# Patient Record
Sex: Female | Born: 1935
Health system: Southern US, Community
[De-identification: ages and names within clinical notes are randomized; demographics above are authoritative.]

---

## 1999-07-16 ENCOUNTER — Other Ambulatory Visit: Admission: RE | Admit: 1999-07-16 | Discharge: 1999-07-29 | Payer: Self-pay

## 2015-10-16 DIAGNOSIS — Z9181 History of falling: Secondary | ICD-10-CM | POA: Diagnosis not present

## 2015-10-16 DIAGNOSIS — Z87898 Personal history of other specified conditions: Secondary | ICD-10-CM | POA: Diagnosis not present

## 2015-10-16 DIAGNOSIS — Z79899 Other long term (current) drug therapy: Secondary | ICD-10-CM | POA: Diagnosis not present

## 2015-10-16 DIAGNOSIS — E669 Obesity, unspecified: Secondary | ICD-10-CM | POA: Diagnosis not present

## 2015-10-16 DIAGNOSIS — R42 Dizziness and giddiness: Secondary | ICD-10-CM | POA: Diagnosis not present

## 2015-10-16 DIAGNOSIS — I10 Essential (primary) hypertension: Secondary | ICD-10-CM | POA: Diagnosis not present

## 2015-11-20 DIAGNOSIS — I10 Essential (primary) hypertension: Secondary | ICD-10-CM | POA: Diagnosis not present

## 2015-11-28 DIAGNOSIS — F209 Schizophrenia, unspecified: Secondary | ICD-10-CM | POA: Diagnosis present

## 2015-11-28 DIAGNOSIS — N189 Chronic kidney disease, unspecified: Secondary | ICD-10-CM | POA: Diagnosis present

## 2015-11-28 DIAGNOSIS — R918 Other nonspecific abnormal finding of lung field: Secondary | ICD-10-CM | POA: Diagnosis not present

## 2015-11-28 DIAGNOSIS — A4151 Sepsis due to Escherichia coli [E. coli]: Secondary | ICD-10-CM | POA: Diagnosis present

## 2015-11-28 DIAGNOSIS — I878 Other specified disorders of veins: Secondary | ICD-10-CM | POA: Diagnosis present

## 2015-11-28 DIAGNOSIS — Z87891 Personal history of nicotine dependence: Secondary | ICD-10-CM | POA: Diagnosis not present

## 2015-11-28 DIAGNOSIS — D62 Acute posthemorrhagic anemia: Secondary | ICD-10-CM | POA: Diagnosis present

## 2015-11-28 DIAGNOSIS — M542 Cervicalgia: Secondary | ICD-10-CM | POA: Diagnosis not present

## 2015-11-28 DIAGNOSIS — I350 Nonrheumatic aortic (valve) stenosis: Secondary | ICD-10-CM | POA: Diagnosis present

## 2015-11-28 DIAGNOSIS — E782 Mixed hyperlipidemia: Secondary | ICD-10-CM | POA: Diagnosis present

## 2015-11-28 DIAGNOSIS — Z23 Encounter for immunization: Secondary | ICD-10-CM | POA: Diagnosis not present

## 2015-11-28 DIAGNOSIS — R4182 Altered mental status, unspecified: Secondary | ICD-10-CM | POA: Diagnosis not present

## 2015-11-28 DIAGNOSIS — I129 Hypertensive chronic kidney disease with stage 1 through stage 4 chronic kidney disease, or unspecified chronic kidney disease: Secondary | ICD-10-CM | POA: Diagnosis present

## 2015-11-28 DIAGNOSIS — K5790 Diverticulosis of intestine, part unspecified, without perforation or abscess without bleeding: Secondary | ICD-10-CM | POA: Diagnosis not present

## 2015-11-28 DIAGNOSIS — B9620 Unspecified Escherichia coli [E. coli] as the cause of diseases classified elsewhere: Secondary | ICD-10-CM | POA: Diagnosis present

## 2015-11-28 DIAGNOSIS — G4733 Obstructive sleep apnea (adult) (pediatric): Secondary | ICD-10-CM | POA: Diagnosis present

## 2015-11-28 DIAGNOSIS — A419 Sepsis, unspecified organism: Secondary | ICD-10-CM | POA: Diagnosis not present

## 2015-11-28 DIAGNOSIS — J9811 Atelectasis: Secondary | ICD-10-CM | POA: Diagnosis not present

## 2015-11-28 DIAGNOSIS — I8392 Asymptomatic varicose veins of left lower extremity: Secondary | ICD-10-CM | POA: Diagnosis not present

## 2015-11-28 DIAGNOSIS — R911 Solitary pulmonary nodule: Secondary | ICD-10-CM | POA: Diagnosis not present

## 2015-11-28 DIAGNOSIS — K5909 Other constipation: Secondary | ICD-10-CM | POA: Diagnosis present

## 2015-11-28 DIAGNOSIS — N3090 Cystitis, unspecified without hematuria: Secondary | ICD-10-CM | POA: Diagnosis present

## 2015-11-28 DIAGNOSIS — R6520 Severe sepsis without septic shock: Secondary | ICD-10-CM | POA: Diagnosis not present

## 2015-11-28 DIAGNOSIS — G629 Polyneuropathy, unspecified: Secondary | ICD-10-CM | POA: Diagnosis present

## 2015-11-28 DIAGNOSIS — I83892 Varicose veins of left lower extremities with other complications: Secondary | ICD-10-CM | POA: Diagnosis present

## 2015-11-28 DIAGNOSIS — F319 Bipolar disorder, unspecified: Secondary | ICD-10-CM | POA: Diagnosis present

## 2015-11-28 DIAGNOSIS — R296 Repeated falls: Secondary | ICD-10-CM | POA: Diagnosis present

## 2015-11-28 DIAGNOSIS — N179 Acute kidney failure, unspecified: Secondary | ICD-10-CM | POA: Diagnosis not present

## 2015-11-28 DIAGNOSIS — N390 Urinary tract infection, site not specified: Secondary | ICD-10-CM | POA: Diagnosis not present

## 2015-11-28 DIAGNOSIS — S79919A Unspecified injury of unspecified hip, initial encounter: Secondary | ICD-10-CM | POA: Diagnosis not present

## 2015-12-08 DIAGNOSIS — Z09 Encounter for follow-up examination after completed treatment for conditions other than malignant neoplasm: Secondary | ICD-10-CM | POA: Diagnosis not present

## 2015-12-08 DIAGNOSIS — D649 Anemia, unspecified: Secondary | ICD-10-CM | POA: Diagnosis not present

## 2015-12-08 DIAGNOSIS — R296 Repeated falls: Secondary | ICD-10-CM | POA: Diagnosis not present

## 2015-12-08 DIAGNOSIS — Z5181 Encounter for therapeutic drug level monitoring: Secondary | ICD-10-CM | POA: Diagnosis not present

## 2015-12-14 DIAGNOSIS — Z87891 Personal history of nicotine dependence: Secondary | ICD-10-CM | POA: Diagnosis not present

## 2015-12-14 DIAGNOSIS — M4812 Ankylosing hyperostosis [Forestier], cervical region: Secondary | ICD-10-CM | POA: Diagnosis not present

## 2015-12-14 DIAGNOSIS — N182 Chronic kidney disease, stage 2 (mild): Secondary | ICD-10-CM | POA: Diagnosis not present

## 2015-12-14 DIAGNOSIS — W010XXD Fall on same level from slipping, tripping and stumbling without subsequent striking against object, subsequent encounter: Secondary | ICD-10-CM | POA: Diagnosis not present

## 2015-12-14 DIAGNOSIS — R296 Repeated falls: Secondary | ICD-10-CM | POA: Diagnosis not present

## 2015-12-14 DIAGNOSIS — I129 Hypertensive chronic kidney disease with stage 1 through stage 4 chronic kidney disease, or unspecified chronic kidney disease: Secondary | ICD-10-CM | POA: Diagnosis not present

## 2015-12-14 DIAGNOSIS — M5134 Other intervertebral disc degeneration, thoracic region: Secondary | ICD-10-CM | POA: Diagnosis not present

## 2015-12-14 DIAGNOSIS — M47817 Spondylosis without myelopathy or radiculopathy, lumbosacral region: Secondary | ICD-10-CM | POA: Diagnosis not present

## 2015-12-14 DIAGNOSIS — Z9181 History of falling: Secondary | ICD-10-CM | POA: Diagnosis not present

## 2015-12-15 DIAGNOSIS — M4812 Ankylosing hyperostosis [Forestier], cervical region: Secondary | ICD-10-CM | POA: Diagnosis not present

## 2015-12-15 DIAGNOSIS — M5134 Other intervertebral disc degeneration, thoracic region: Secondary | ICD-10-CM | POA: Diagnosis not present

## 2015-12-15 DIAGNOSIS — N182 Chronic kidney disease, stage 2 (mild): Secondary | ICD-10-CM | POA: Diagnosis not present

## 2015-12-15 DIAGNOSIS — M47817 Spondylosis without myelopathy or radiculopathy, lumbosacral region: Secondary | ICD-10-CM | POA: Diagnosis not present

## 2015-12-15 DIAGNOSIS — R296 Repeated falls: Secondary | ICD-10-CM | POA: Diagnosis not present

## 2015-12-15 DIAGNOSIS — I129 Hypertensive chronic kidney disease with stage 1 through stage 4 chronic kidney disease, or unspecified chronic kidney disease: Secondary | ICD-10-CM | POA: Diagnosis not present

## 2015-12-17 DIAGNOSIS — R296 Repeated falls: Secondary | ICD-10-CM | POA: Diagnosis not present

## 2015-12-17 DIAGNOSIS — M47817 Spondylosis without myelopathy or radiculopathy, lumbosacral region: Secondary | ICD-10-CM | POA: Diagnosis not present

## 2015-12-17 DIAGNOSIS — I129 Hypertensive chronic kidney disease with stage 1 through stage 4 chronic kidney disease, or unspecified chronic kidney disease: Secondary | ICD-10-CM | POA: Diagnosis not present

## 2015-12-17 DIAGNOSIS — M5134 Other intervertebral disc degeneration, thoracic region: Secondary | ICD-10-CM | POA: Diagnosis not present

## 2015-12-17 DIAGNOSIS — N182 Chronic kidney disease, stage 2 (mild): Secondary | ICD-10-CM | POA: Diagnosis not present

## 2015-12-17 DIAGNOSIS — M4812 Ankylosing hyperostosis [Forestier], cervical region: Secondary | ICD-10-CM | POA: Diagnosis not present

## 2015-12-21 DIAGNOSIS — M4812 Ankylosing hyperostosis [Forestier], cervical region: Secondary | ICD-10-CM | POA: Diagnosis not present

## 2015-12-21 DIAGNOSIS — M47817 Spondylosis without myelopathy or radiculopathy, lumbosacral region: Secondary | ICD-10-CM | POA: Diagnosis not present

## 2015-12-21 DIAGNOSIS — N182 Chronic kidney disease, stage 2 (mild): Secondary | ICD-10-CM | POA: Diagnosis not present

## 2015-12-21 DIAGNOSIS — M5134 Other intervertebral disc degeneration, thoracic region: Secondary | ICD-10-CM | POA: Diagnosis not present

## 2015-12-21 DIAGNOSIS — R296 Repeated falls: Secondary | ICD-10-CM | POA: Diagnosis not present

## 2015-12-21 DIAGNOSIS — I129 Hypertensive chronic kidney disease with stage 1 through stage 4 chronic kidney disease, or unspecified chronic kidney disease: Secondary | ICD-10-CM | POA: Diagnosis not present

## 2015-12-28 DIAGNOSIS — M5134 Other intervertebral disc degeneration, thoracic region: Secondary | ICD-10-CM | POA: Diagnosis not present

## 2015-12-28 DIAGNOSIS — M47817 Spondylosis without myelopathy or radiculopathy, lumbosacral region: Secondary | ICD-10-CM | POA: Diagnosis not present

## 2015-12-28 DIAGNOSIS — M4812 Ankylosing hyperostosis [Forestier], cervical region: Secondary | ICD-10-CM | POA: Diagnosis not present

## 2015-12-28 DIAGNOSIS — I129 Hypertensive chronic kidney disease with stage 1 through stage 4 chronic kidney disease, or unspecified chronic kidney disease: Secondary | ICD-10-CM | POA: Diagnosis not present

## 2015-12-28 DIAGNOSIS — N182 Chronic kidney disease, stage 2 (mild): Secondary | ICD-10-CM | POA: Diagnosis not present

## 2015-12-28 DIAGNOSIS — R296 Repeated falls: Secondary | ICD-10-CM | POA: Diagnosis not present

## 2015-12-30 DIAGNOSIS — N182 Chronic kidney disease, stage 2 (mild): Secondary | ICD-10-CM | POA: Diagnosis not present

## 2015-12-30 DIAGNOSIS — M5134 Other intervertebral disc degeneration, thoracic region: Secondary | ICD-10-CM | POA: Diagnosis not present

## 2015-12-30 DIAGNOSIS — M47817 Spondylosis without myelopathy or radiculopathy, lumbosacral region: Secondary | ICD-10-CM | POA: Diagnosis not present

## 2015-12-30 DIAGNOSIS — R296 Repeated falls: Secondary | ICD-10-CM | POA: Diagnosis not present

## 2015-12-30 DIAGNOSIS — M4812 Ankylosing hyperostosis [Forestier], cervical region: Secondary | ICD-10-CM | POA: Diagnosis not present

## 2015-12-30 DIAGNOSIS — I129 Hypertensive chronic kidney disease with stage 1 through stage 4 chronic kidney disease, or unspecified chronic kidney disease: Secondary | ICD-10-CM | POA: Diagnosis not present

## 2016-01-01 DIAGNOSIS — M4812 Ankylosing hyperostosis [Forestier], cervical region: Secondary | ICD-10-CM | POA: Diagnosis not present

## 2016-01-01 DIAGNOSIS — M47817 Spondylosis without myelopathy or radiculopathy, lumbosacral region: Secondary | ICD-10-CM | POA: Diagnosis not present

## 2016-01-01 DIAGNOSIS — R296 Repeated falls: Secondary | ICD-10-CM | POA: Diagnosis not present

## 2016-01-01 DIAGNOSIS — M5135 Other intervertebral disc degeneration, thoracolumbar region: Secondary | ICD-10-CM | POA: Diagnosis not present

## 2016-01-04 DIAGNOSIS — N182 Chronic kidney disease, stage 2 (mild): Secondary | ICD-10-CM | POA: Diagnosis not present

## 2016-01-04 DIAGNOSIS — M4812 Ankylosing hyperostosis [Forestier], cervical region: Secondary | ICD-10-CM | POA: Diagnosis not present

## 2016-01-04 DIAGNOSIS — M47817 Spondylosis without myelopathy or radiculopathy, lumbosacral region: Secondary | ICD-10-CM | POA: Diagnosis not present

## 2016-01-04 DIAGNOSIS — R296 Repeated falls: Secondary | ICD-10-CM | POA: Diagnosis not present

## 2016-01-04 DIAGNOSIS — M5134 Other intervertebral disc degeneration, thoracic region: Secondary | ICD-10-CM | POA: Diagnosis not present

## 2016-01-04 DIAGNOSIS — I129 Hypertensive chronic kidney disease with stage 1 through stage 4 chronic kidney disease, or unspecified chronic kidney disease: Secondary | ICD-10-CM | POA: Diagnosis not present

## 2016-01-06 DIAGNOSIS — N182 Chronic kidney disease, stage 2 (mild): Secondary | ICD-10-CM | POA: Diagnosis not present

## 2016-01-06 DIAGNOSIS — R296 Repeated falls: Secondary | ICD-10-CM | POA: Diagnosis not present

## 2016-01-06 DIAGNOSIS — I129 Hypertensive chronic kidney disease with stage 1 through stage 4 chronic kidney disease, or unspecified chronic kidney disease: Secondary | ICD-10-CM | POA: Diagnosis not present

## 2016-01-06 DIAGNOSIS — M5134 Other intervertebral disc degeneration, thoracic region: Secondary | ICD-10-CM | POA: Diagnosis not present

## 2016-01-06 DIAGNOSIS — M4812 Ankylosing hyperostosis [Forestier], cervical region: Secondary | ICD-10-CM | POA: Diagnosis not present

## 2016-01-06 DIAGNOSIS — M47817 Spondylosis without myelopathy or radiculopathy, lumbosacral region: Secondary | ICD-10-CM | POA: Diagnosis not present

## 2016-01-11 DIAGNOSIS — M4812 Ankylosing hyperostosis [Forestier], cervical region: Secondary | ICD-10-CM | POA: Diagnosis not present

## 2016-01-11 DIAGNOSIS — I129 Hypertensive chronic kidney disease with stage 1 through stage 4 chronic kidney disease, or unspecified chronic kidney disease: Secondary | ICD-10-CM | POA: Diagnosis not present

## 2016-01-11 DIAGNOSIS — M5134 Other intervertebral disc degeneration, thoracic region: Secondary | ICD-10-CM | POA: Diagnosis not present

## 2016-01-11 DIAGNOSIS — R296 Repeated falls: Secondary | ICD-10-CM | POA: Diagnosis not present

## 2016-01-11 DIAGNOSIS — M47817 Spondylosis without myelopathy or radiculopathy, lumbosacral region: Secondary | ICD-10-CM | POA: Diagnosis not present

## 2016-01-11 DIAGNOSIS — N182 Chronic kidney disease, stage 2 (mild): Secondary | ICD-10-CM | POA: Diagnosis not present

## 2016-01-13 DIAGNOSIS — N182 Chronic kidney disease, stage 2 (mild): Secondary | ICD-10-CM | POA: Diagnosis not present

## 2016-01-13 DIAGNOSIS — M5134 Other intervertebral disc degeneration, thoracic region: Secondary | ICD-10-CM | POA: Diagnosis not present

## 2016-01-13 DIAGNOSIS — M47817 Spondylosis without myelopathy or radiculopathy, lumbosacral region: Secondary | ICD-10-CM | POA: Diagnosis not present

## 2016-01-13 DIAGNOSIS — R296 Repeated falls: Secondary | ICD-10-CM | POA: Diagnosis not present

## 2016-01-13 DIAGNOSIS — I129 Hypertensive chronic kidney disease with stage 1 through stage 4 chronic kidney disease, or unspecified chronic kidney disease: Secondary | ICD-10-CM | POA: Diagnosis not present

## 2016-01-13 DIAGNOSIS — M4812 Ankylosing hyperostosis [Forestier], cervical region: Secondary | ICD-10-CM | POA: Diagnosis not present

## 2016-01-27 DIAGNOSIS — H401131 Primary open-angle glaucoma, bilateral, mild stage: Secondary | ICD-10-CM | POA: Diagnosis not present

## 2016-01-27 DIAGNOSIS — H2511 Age-related nuclear cataract, right eye: Secondary | ICD-10-CM | POA: Diagnosis not present

## 2016-02-18 DIAGNOSIS — E669 Obesity, unspecified: Secondary | ICD-10-CM | POA: Diagnosis not present

## 2016-03-17 DIAGNOSIS — E669 Obesity, unspecified: Secondary | ICD-10-CM | POA: Diagnosis not present

## 2016-04-26 DIAGNOSIS — H401131 Primary open-angle glaucoma, bilateral, mild stage: Secondary | ICD-10-CM | POA: Diagnosis not present

## 2016-04-26 DIAGNOSIS — H2511 Age-related nuclear cataract, right eye: Secondary | ICD-10-CM | POA: Diagnosis not present

## 2016-07-08 DIAGNOSIS — Z23 Encounter for immunization: Secondary | ICD-10-CM | POA: Diagnosis not present

## 2016-07-08 DIAGNOSIS — R635 Abnormal weight gain: Secondary | ICD-10-CM | POA: Diagnosis not present

## 2017-11-01 DIAGNOSIS — R635 Abnormal weight gain: Secondary | ICD-10-CM | POA: Diagnosis not present

## 2017-11-09 DIAGNOSIS — M1711 Unilateral primary osteoarthritis, right knee: Secondary | ICD-10-CM | POA: Diagnosis not present

## 2017-11-09 DIAGNOSIS — N183 Chronic kidney disease, stage 3 (moderate): Secondary | ICD-10-CM | POA: Diagnosis not present

## 2017-11-09 DIAGNOSIS — R531 Weakness: Secondary | ICD-10-CM | POA: Diagnosis not present

## 2017-11-09 DIAGNOSIS — K5732 Diverticulitis of large intestine without perforation or abscess without bleeding: Secondary | ICD-10-CM | POA: Diagnosis not present

## 2017-11-09 DIAGNOSIS — M160 Bilateral primary osteoarthritis of hip: Secondary | ICD-10-CM | POA: Diagnosis not present

## 2017-11-09 DIAGNOSIS — R109 Unspecified abdominal pain: Secondary | ICD-10-CM | POA: Diagnosis not present

## 2017-11-09 DIAGNOSIS — Z66 Do not resuscitate: Secondary | ICD-10-CM | POA: Diagnosis not present

## 2017-11-09 DIAGNOSIS — K6389 Other specified diseases of intestine: Secondary | ICD-10-CM | POA: Diagnosis not present

## 2017-11-09 DIAGNOSIS — M25551 Pain in right hip: Secondary | ICD-10-CM | POA: Diagnosis not present

## 2017-11-09 DIAGNOSIS — M25511 Pain in right shoulder: Secondary | ICD-10-CM | POA: Diagnosis not present

## 2017-11-10 DIAGNOSIS — N183 Chronic kidney disease, stage 3 (moderate): Secondary | ICD-10-CM | POA: Diagnosis present

## 2017-11-10 DIAGNOSIS — R911 Solitary pulmonary nodule: Secondary | ICD-10-CM | POA: Diagnosis present

## 2017-11-10 DIAGNOSIS — Z87891 Personal history of nicotine dependence: Secondary | ICD-10-CM | POA: Diagnosis not present

## 2017-11-10 DIAGNOSIS — R1311 Dysphagia, oral phase: Secondary | ICD-10-CM | POA: Diagnosis not present

## 2017-11-10 DIAGNOSIS — Z9181 History of falling: Secondary | ICD-10-CM | POA: Diagnosis not present

## 2017-11-10 DIAGNOSIS — R531 Weakness: Secondary | ICD-10-CM | POA: Diagnosis present

## 2017-11-10 DIAGNOSIS — R29818 Other symptoms and signs involving the nervous system: Secondary | ICD-10-CM | POA: Diagnosis not present

## 2017-11-10 DIAGNOSIS — Z66 Do not resuscitate: Secondary | ICD-10-CM | POA: Diagnosis present

## 2017-11-10 DIAGNOSIS — M25511 Pain in right shoulder: Secondary | ICD-10-CM | POA: Diagnosis present

## 2017-11-10 DIAGNOSIS — I639 Cerebral infarction, unspecified: Secondary | ICD-10-CM | POA: Diagnosis not present

## 2017-11-10 DIAGNOSIS — M79601 Pain in right arm: Secondary | ICD-10-CM | POA: Diagnosis not present

## 2017-11-10 DIAGNOSIS — I6320 Cerebral infarction due to unspecified occlusion or stenosis of unspecified precerebral arteries: Secondary | ICD-10-CM | POA: Diagnosis not present

## 2017-11-10 DIAGNOSIS — E782 Mixed hyperlipidemia: Secondary | ICD-10-CM | POA: Diagnosis not present

## 2017-11-10 DIAGNOSIS — I6782 Cerebral ischemia: Secondary | ICD-10-CM | POA: Diagnosis not present

## 2017-11-10 DIAGNOSIS — M6281 Muscle weakness (generalized): Secondary | ICD-10-CM | POA: Diagnosis not present

## 2017-11-10 DIAGNOSIS — Z8673 Personal history of transient ischemic attack (TIA), and cerebral infarction without residual deficits: Secondary | ICD-10-CM | POA: Diagnosis not present

## 2017-11-10 DIAGNOSIS — G8929 Other chronic pain: Secondary | ICD-10-CM | POA: Diagnosis present

## 2017-11-10 DIAGNOSIS — I129 Hypertensive chronic kidney disease with stage 1 through stage 4 chronic kidney disease, or unspecified chronic kidney disease: Secondary | ICD-10-CM | POA: Diagnosis present

## 2017-11-10 DIAGNOSIS — R41841 Cognitive communication deficit: Secondary | ICD-10-CM | POA: Diagnosis not present

## 2017-11-10 DIAGNOSIS — M25551 Pain in right hip: Secondary | ICD-10-CM | POA: Diagnosis present

## 2017-11-10 DIAGNOSIS — N3000 Acute cystitis without hematuria: Secondary | ICD-10-CM | POA: Diagnosis not present

## 2017-11-10 DIAGNOSIS — I350 Nonrheumatic aortic (valve) stenosis: Secondary | ICD-10-CM | POA: Diagnosis present

## 2017-11-10 DIAGNOSIS — R109 Unspecified abdominal pain: Secondary | ICD-10-CM | POA: Diagnosis not present

## 2017-11-10 DIAGNOSIS — F329 Major depressive disorder, single episode, unspecified: Secondary | ICD-10-CM | POA: Diagnosis present

## 2017-11-10 DIAGNOSIS — R1312 Dysphagia, oropharyngeal phase: Secondary | ICD-10-CM | POA: Diagnosis not present

## 2017-11-10 DIAGNOSIS — F259 Schizoaffective disorder, unspecified: Secondary | ICD-10-CM | POA: Diagnosis present

## 2017-11-10 DIAGNOSIS — G629 Polyneuropathy, unspecified: Secondary | ICD-10-CM | POA: Diagnosis present

## 2017-11-10 DIAGNOSIS — R262 Difficulty in walking, not elsewhere classified: Secondary | ICD-10-CM | POA: Diagnosis not present

## 2017-11-10 DIAGNOSIS — K5732 Diverticulitis of large intestine without perforation or abscess without bleeding: Secondary | ICD-10-CM | POA: Diagnosis present

## 2017-11-10 DIAGNOSIS — M160 Bilateral primary osteoarthritis of hip: Secondary | ICD-10-CM | POA: Diagnosis present

## 2017-11-10 DIAGNOSIS — R51 Headache: Secondary | ICD-10-CM | POA: Diagnosis not present

## 2017-11-14 DIAGNOSIS — I639 Cerebral infarction, unspecified: Secondary | ICD-10-CM | POA: Diagnosis not present

## 2017-11-14 DIAGNOSIS — K5732 Diverticulitis of large intestine without perforation or abscess without bleeding: Secondary | ICD-10-CM | POA: Diagnosis not present

## 2017-11-14 DIAGNOSIS — Z9181 History of falling: Secondary | ICD-10-CM | POA: Diagnosis not present

## 2017-11-14 DIAGNOSIS — R1311 Dysphagia, oral phase: Secondary | ICD-10-CM | POA: Diagnosis not present

## 2017-11-14 DIAGNOSIS — R41841 Cognitive communication deficit: Secondary | ICD-10-CM | POA: Diagnosis not present

## 2017-11-14 DIAGNOSIS — R262 Difficulty in walking, not elsewhere classified: Secondary | ICD-10-CM | POA: Diagnosis not present

## 2017-11-14 DIAGNOSIS — M79601 Pain in right arm: Secondary | ICD-10-CM | POA: Diagnosis not present

## 2017-11-14 DIAGNOSIS — R5381 Other malaise: Secondary | ICD-10-CM | POA: Diagnosis not present

## 2017-11-14 DIAGNOSIS — N183 Chronic kidney disease, stage 3 (moderate): Secondary | ICD-10-CM | POA: Diagnosis not present

## 2017-11-14 DIAGNOSIS — M25511 Pain in right shoulder: Secondary | ICD-10-CM | POA: Diagnosis not present

## 2017-11-14 DIAGNOSIS — I10 Essential (primary) hypertension: Secondary | ICD-10-CM | POA: Diagnosis not present

## 2017-11-14 DIAGNOSIS — I350 Nonrheumatic aortic (valve) stenosis: Secondary | ICD-10-CM | POA: Diagnosis not present

## 2017-11-14 DIAGNOSIS — E782 Mixed hyperlipidemia: Secondary | ICD-10-CM | POA: Diagnosis not present

## 2017-11-14 DIAGNOSIS — M6281 Muscle weakness (generalized): Secondary | ICD-10-CM | POA: Diagnosis not present

## 2017-11-14 DIAGNOSIS — D649 Anemia, unspecified: Secondary | ICD-10-CM | POA: Diagnosis not present

## 2017-11-14 DIAGNOSIS — M25551 Pain in right hip: Secondary | ICD-10-CM | POA: Diagnosis not present

## 2017-11-15 DIAGNOSIS — R5381 Other malaise: Secondary | ICD-10-CM | POA: Diagnosis not present

## 2017-11-16 DIAGNOSIS — R5381 Other malaise: Secondary | ICD-10-CM | POA: Diagnosis not present

## 2017-11-22 DIAGNOSIS — R5381 Other malaise: Secondary | ICD-10-CM | POA: Diagnosis not present

## 2017-12-01 DIAGNOSIS — R5381 Other malaise: Secondary | ICD-10-CM | POA: Diagnosis not present

## 2017-12-11 DIAGNOSIS — F259 Schizoaffective disorder, unspecified: Secondary | ICD-10-CM | POA: Diagnosis not present

## 2017-12-11 DIAGNOSIS — Z8673 Personal history of transient ischemic attack (TIA), and cerebral infarction without residual deficits: Secondary | ICD-10-CM | POA: Diagnosis not present

## 2017-12-11 DIAGNOSIS — F251 Schizoaffective disorder, depressive type: Secondary | ICD-10-CM | POA: Diagnosis not present

## 2017-12-11 DIAGNOSIS — M25551 Pain in right hip: Secondary | ICD-10-CM | POA: Diagnosis not present

## 2017-12-11 DIAGNOSIS — N189 Chronic kidney disease, unspecified: Secondary | ICD-10-CM | POA: Diagnosis not present

## 2017-12-11 DIAGNOSIS — I129 Hypertensive chronic kidney disease with stage 1 through stage 4 chronic kidney disease, or unspecified chronic kidney disease: Secondary | ICD-10-CM | POA: Diagnosis not present

## 2017-12-11 DIAGNOSIS — M25511 Pain in right shoulder: Secondary | ICD-10-CM | POA: Diagnosis not present

## 2017-12-11 DIAGNOSIS — R1310 Dysphagia, unspecified: Secondary | ICD-10-CM | POA: Diagnosis not present

## 2017-12-18 DIAGNOSIS — I639 Cerebral infarction, unspecified: Secondary | ICD-10-CM | POA: Diagnosis not present

## 2017-12-18 DIAGNOSIS — G9009 Other idiopathic peripheral autonomic neuropathy: Secondary | ICD-10-CM | POA: Diagnosis not present

## 2017-12-18 DIAGNOSIS — G894 Chronic pain syndrome: Secondary | ICD-10-CM | POA: Diagnosis not present

## 2017-12-18 DIAGNOSIS — E782 Mixed hyperlipidemia: Secondary | ICD-10-CM | POA: Diagnosis not present

## 2018-02-19 DIAGNOSIS — M79675 Pain in left toe(s): Secondary | ICD-10-CM | POA: Diagnosis not present

## 2018-02-19 DIAGNOSIS — I739 Peripheral vascular disease, unspecified: Secondary | ICD-10-CM | POA: Diagnosis not present

## 2018-02-19 DIAGNOSIS — B351 Tinea unguium: Secondary | ICD-10-CM | POA: Diagnosis not present

## 2018-02-19 DIAGNOSIS — M79674 Pain in right toe(s): Secondary | ICD-10-CM | POA: Diagnosis not present

## 2019-04-09 ENCOUNTER — Emergency Department (HOSPITAL_COMMUNITY)
Admission: EM | Admit: 2019-04-09 | Discharge: 2019-04-10 | Disposition: A | Discharge status: Home / Self Care | Payer: Medicare Other | Attending: Emergency Medicine | Admitting: Emergency Medicine

## 2019-04-09 ENCOUNTER — Other Ambulatory Visit: Payer: Self-pay

## 2019-04-09 ENCOUNTER — Emergency Department (HOSPITAL_COMMUNITY): Payer: Medicare Other

## 2019-04-09 ENCOUNTER — Encounter (HOSPITAL_COMMUNITY): Payer: Self-pay | Admitting: *Deleted

## 2019-04-09 ENCOUNTER — Emergency Department (HOSPITAL_COMMUNITY): Admission: EM | Admit: 2019-04-09 | Discharge: 2019-04-09 | Discharge status: Absent without leave | Payer: Self-pay

## 2019-04-09 DIAGNOSIS — M5489 Other dorsalgia: Secondary | ICD-10-CM | POA: Diagnosis not present

## 2019-04-09 DIAGNOSIS — S3993XA Unspecified injury of pelvis, initial encounter: Secondary | ICD-10-CM | POA: Diagnosis not present

## 2019-04-09 DIAGNOSIS — S82091A Other fracture of right patella, initial encounter for closed fracture: Secondary | ICD-10-CM | POA: Insufficient documentation

## 2019-04-09 DIAGNOSIS — M25461 Effusion, right knee: Secondary | ICD-10-CM | POA: Diagnosis not present

## 2019-04-09 DIAGNOSIS — Z209 Contact with and (suspected) exposure to unspecified communicable disease: Secondary | ICD-10-CM | POA: Diagnosis not present

## 2019-04-09 DIAGNOSIS — Y92512 Supermarket, store or market as the place of occurrence of the external cause: Secondary | ICD-10-CM | POA: Diagnosis not present

## 2019-04-09 DIAGNOSIS — S59901A Unspecified injury of right elbow, initial encounter: Secondary | ICD-10-CM | POA: Diagnosis not present

## 2019-04-09 DIAGNOSIS — W19XXXA Unspecified fall, initial encounter: Secondary | ICD-10-CM

## 2019-04-09 DIAGNOSIS — M25521 Pain in right elbow: Secondary | ICD-10-CM | POA: Diagnosis not present

## 2019-04-09 DIAGNOSIS — M25551 Pain in right hip: Secondary | ICD-10-CM | POA: Diagnosis not present

## 2019-04-09 DIAGNOSIS — M549 Dorsalgia, unspecified: Secondary | ICD-10-CM | POA: Diagnosis not present

## 2019-04-09 DIAGNOSIS — S8991XA Unspecified injury of right lower leg, initial encounter: Secondary | ICD-10-CM | POA: Diagnosis present

## 2019-04-09 DIAGNOSIS — S79911A Unspecified injury of right hip, initial encounter: Secondary | ICD-10-CM | POA: Diagnosis not present

## 2019-04-09 DIAGNOSIS — R52 Pain, unspecified: Secondary | ICD-10-CM | POA: Diagnosis not present

## 2019-04-09 DIAGNOSIS — M545 Low back pain: Secondary | ICD-10-CM | POA: Diagnosis not present

## 2019-04-09 DIAGNOSIS — Y999 Unspecified external cause status: Secondary | ICD-10-CM | POA: Insufficient documentation

## 2019-04-09 DIAGNOSIS — R102 Pelvic and perineal pain: Secondary | ICD-10-CM | POA: Insufficient documentation

## 2019-04-09 DIAGNOSIS — W01198A Fall on same level from slipping, tripping and stumbling with subsequent striking against other object, initial encounter: Secondary | ICD-10-CM | POA: Insufficient documentation

## 2019-04-09 DIAGNOSIS — Y9389 Activity, other specified: Secondary | ICD-10-CM | POA: Insufficient documentation

## 2019-04-09 DIAGNOSIS — I129 Hypertensive chronic kidney disease with stage 1 through stage 4 chronic kidney disease, or unspecified chronic kidney disease: Secondary | ICD-10-CM | POA: Insufficient documentation

## 2019-04-09 DIAGNOSIS — M5136 Other intervertebral disc degeneration, lumbar region: Secondary | ICD-10-CM | POA: Diagnosis not present

## 2019-04-09 DIAGNOSIS — M25561 Pain in right knee: Secondary | ICD-10-CM | POA: Diagnosis not present

## 2019-04-09 DIAGNOSIS — S3992XA Unspecified injury of lower back, initial encounter: Secondary | ICD-10-CM | POA: Diagnosis not present

## 2019-04-09 DIAGNOSIS — I10 Essential (primary) hypertension: Secondary | ICD-10-CM | POA: Diagnosis not present

## 2019-04-09 DIAGNOSIS — N189 Chronic kidney disease, unspecified: Secondary | ICD-10-CM | POA: Diagnosis not present

## 2019-04-09 LAB — CBC WITH DIFFERENTIAL/PLATELET
Abs Immature Granulocytes: 0.04 10*3/uL (ref 0.00–0.07)
Basophils Absolute: 0.1 10*3/uL (ref 0.0–0.1)
Basophils Relative: 1 %
Eosinophils Absolute: 0.2 10*3/uL (ref 0.0–0.5)
Eosinophils Relative: 3 %
HCT: 40.7 % (ref 36.0–46.0)
Hemoglobin: 12.5 g/dL (ref 12.0–15.0)
Immature Granulocytes: 1 %
Lymphocytes Relative: 20 %
Lymphs Abs: 1.6 10*3/uL (ref 0.7–4.0)
MCH: 28.6 pg (ref 26.0–34.0)
MCHC: 30.7 g/dL (ref 30.0–36.0)
MCV: 93.1 fL (ref 80.0–100.0)
Monocytes Absolute: 0.8 10*3/uL (ref 0.1–1.0)
Monocytes Relative: 10 %
Neutro Abs: 5.4 10*3/uL (ref 1.7–7.7)
Neutrophils Relative %: 65 %
Platelets: 205 10*3/uL (ref 150–400)
RBC: 4.37 MIL/uL (ref 3.87–5.11)
RDW: 14 % (ref 11.5–15.5)
WBC: 8.1 10*3/uL (ref 4.0–10.5)
nRBC: 0 % (ref 0.0–0.2)

## 2019-04-09 LAB — BASIC METABOLIC PANEL
Anion gap: 10 (ref 5–15)
BUN: 27 mg/dL — ABNORMAL HIGH (ref 8–23)
CO2: 24 mmol/L (ref 22–32)
Calcium: 9.3 mg/dL (ref 8.9–10.3)
Chloride: 107 mmol/L (ref 98–111)
Creatinine, Ser: 1.42 mg/dL — ABNORMAL HIGH (ref 0.44–1.00)
GFR calc Af Amer: 39 mL/min — ABNORMAL LOW (ref >60)
GFR calc non Af Amer: 34 mL/min — ABNORMAL LOW (ref >60)
Glucose, Bld: 107 mg/dL — ABNORMAL HIGH (ref 70–99)
Potassium: 4 mmol/L (ref 3.5–5.1)
Sodium: 141 mmol/L (ref 135–145)

## 2019-04-09 MED ORDER — MORPHINE SULFATE (PF) 4 MG/ML IV SOLN
4.0000 mg | Freq: Once | INTRAVENOUS | Status: AC
  Administered 2019-04-09: 4 mg via INTRAVENOUS
  Filled 2019-04-09: qty 1

## 2019-04-09 MED ORDER — ONDANSETRON HCL 4 MG/2ML IJ SOLN
4.0000 mg | Freq: Once | INTRAMUSCULAR | Status: AC
  Administered 2019-04-09: 4 mg via INTRAVENOUS
  Filled 2019-04-09: qty 2

## 2019-04-09 NOTE — ED Triage Notes (Signed)
Reported that pt fell face forward while climbing steps, abrasion noted to right knee.  Denies hitting her head. Denies taking blood thinners.

## 2019-04-09 NOTE — ED Notes (Signed)
Pt c/o back pain and right knee pain after falling through door after opening it. Pt denies hitting her head.

## 2019-04-09 NOTE — ED Notes (Signed)
Pt was sent to xray and per xray tech pt could not stand lying on table due to sob and pain. EDPA notifed.

## 2019-04-10 MED ORDER — HYDROCODONE-ACETAMINOPHEN 5-325 MG PO TABS: 1.0000 | ORAL_TABLET | ORAL | 0 refills | Status: AC | PRN

## 2019-04-10 NOTE — ED Provider Notes (Signed)
[facility] EMERGENCY DEPARTMENT Provider Note   CSN: [id number] Arrival date & time: 04/09/19  1856     History   Chief Complaint Chief Complaint  Patient presents with   Fall    HPI Chloe Flynn is a 85 y.o. female with a history of cva, HTN, aortic stenosis, chronic kidney disease, bipolar disorder, hyperlipidemia, HTN, presenting for evaluation from a fall occurring 3 hours before arrival.  She describes tripping while walking into a grocery store, landing on her right knee, then her right side, twisting her back, also sustaining minor blow to her right elbow.  She denies head injury or head/neck pain since the incident.  She was helped up off the floor and was able to finish shopping, returned to her daughters home and rested in a chair.  Upon trying to rise, she discovered severe worsened pain in her right knee and lower back and right hip area and was unable to ambulate.  She has had no treatment prior to arrival and arrived by ems.  She denies weakness or numbness in her extremities.     The history is provided by the patient.    History reviewed. No pertinent past medical history.  There are no active problems to display for this patient.   History reviewed. No pertinent surgical history.   OB History   No obstetric history on file.      Home Medications    Prior to Admission medications   Medication Sig Start Date End Date Taking? Authorizing Provider  HYDROcodone-acetaminophen (NORCO/VICODIN) 5-325 MG tablet Take 1 tablet by mouth every 4 (four) hours as needed. 04/10/19   Evalee Jefferson, PA-C    Family History History reviewed. No pertinent family history.  Social History Social History   Tobacco Use   Smoking status: Former Smoker   Smokeless tobacco: Never Used  Substance Use Topics   Alcohol use: Not on file   Drug use: Not on file     Allergies   Patient has no known allergies.   Review of Systems Review of Systems  Constitutional:  Negative for fever.  Respiratory: Negative.   Cardiovascular: Negative.   Gastrointestinal: Negative.   Musculoskeletal: Positive for arthralgias, back pain and joint swelling. Negative for myalgias and neck pain.  Skin: Positive for color change.  Neurological: Positive for speech difficulty. Negative for dizziness, seizures, weakness, numbness and headaches.       Has chronic speech issues from cva compounded by being edentulous.      Physical Exam Updated Vital Signs BP (!) 157/54 (BP Location: Left Arm)    Pulse 77    Temp 98.4 F (36.9 C) (Oral)    Resp 17    Ht 5\' 7"  (1.702 m)    SpO2 95%   Physical Exam Constitutional:      Appearance: She is well-developed.  HENT:     Head: Atraumatic.  Neck:     Musculoskeletal: Normal range of motion.  Cardiovascular:     Pulses:          Radial pulses are 2+ on the right side and 2+ on the left side.       Dorsalis pedis pulses are 2+ on the right side and 2+ on the left side.     Comments: Pulses equal bilaterally Abdominal:     General: Bowel sounds are normal.     Tenderness: There is no abdominal tenderness.  Musculoskeletal:        General: Tenderness present.  Right elbow: She exhibits normal range of motion, no swelling and no deformity. Tenderness found. Olecranon process tenderness noted.     Right knee: She exhibits decreased range of motion, swelling, ecchymosis and bony tenderness. She exhibits no erythema, no LCL laxity and no MCL laxity. Tenderness found. Lateral joint line tenderness noted.     Lumbar back: She exhibits bony tenderness. She exhibits no swelling, no edema and no deformity.       Back:     Comments: Early bruising noted patellar region. Pt with severe pain with attempts to flex the right knee. She can SLR the extremity without weakness, knee collapse or pain.  Skin:    General: Skin is warm and dry.  Neurological:     Mental Status: She is alert.     Sensory: No sensory deficit.     Deep Tendon  Reflexes: Reflexes normal.      ED Treatments / Results  Labs (all labs ordered are listed, but only abnormal results are displayed) Labs Reviewed  BASIC METABOLIC PANEL - Abnormal; Notable for the following components:      Result Value   Glucose, Bld 107 (*)    BUN 27 (*)    Creatinine, Ser 1.42 (*)    GFR calc non Af Amer 34 (*)    GFR calc Af Amer 39 (*)    All other components within normal limits  CBC WITH DIFFERENTIAL/PLATELET    EKG None  Radiology Dg Lumbar Spine Complete  Result Date: 04/09/2019 CLINICAL DATA:  Recent fall on steps with low back pain, initial encounter EXAM: LUMBAR SPINE - COMPLETE 4+ VIEW COMPARISON:  None. FINDINGS: Five lumbar type vertebral bodies are well visualized. Vertebral body height is well maintained. No anterolisthesis is noted. Multilevel degenerative changes are noted. No soft tissue abnormality is noted. IMPRESSION: Degenerative change without acute abnormality. Electronically Signed   By: Alcide CleverMark  Lukens M.D.   On: 04/09/2019 22:35   Dg Elbow Complete Right  Result Date: 04/09/2019 CLINICAL DATA:  Recent fall with right elbow pain, initial encounter EXAM: RIGHT ELBOW - COMPLETE 3+ VIEW COMPARISON:  None. FINDINGS: Olecranon spurring is noted. No acute fracture or dislocation is seen. No joint effusion is noted. IMPRESSION: No acute abnormality seen. Electronically Signed   By: Alcide CleverMark  Lukens M.D.   On: 04/09/2019 22:32   Ct Lumbar Spine Wo Contrast  Result Date: 04/10/2019 CLINICAL DATA:  Back pain after falling EXAM: CT LUMBAR SPINE WITHOUT CONTRAST TECHNIQUE: Multidetector CT imaging of the lumbar spine was performed without intravenous contrast administration. Multiplanar CT image reconstructions were also generated. COMPARISON:  None. FINDINGS: Segmentation: 5 lumbar type vertebrae. Alignment: Normal. Vertebrae: No acute fracture or focal pathologic process. Closely opposed spinous processes. Paraspinal and other soft tissues: Calcific  atherosclerosis of the aorta. Disc levels: L1-2: Mild disc bulge without spinal canal stenosis. Mild facet hypertrophy. L2-3: Mild disc bulge without spinal canal stenosis. Moderate facet arthrosis. L3-4: Intermediate disc bulge with moderate facet hypertrophy and ligamentum flavum redundancy. Moderate spinal canal stenosis. Mild right and left neural foraminal stenosis. L4-5: Right eccentric disc bulge with mild-to-moderate right foraminal stenosis. No central spinal canal stenosis. Moderate facet hypertrophy. L5-S1: Left eccentric disc bulge with large osteophyte. Mild facet hypertrophy. No spinal canal stenosis. Mild bilateral neural foraminal stenosis. IMPRESSION: 1. No acute abnormality of the lumbar spine. 2. Moderate spinal canal stenosis at L3-4 secondary to intermediate sized disc bulge and moderate facet hypertrophy. 3. Multilevel facet arthrosis, which may be a source of  local low back pain. 4.  Aortic atherosclerosis (ICD10-I70.0). Electronically Signed   By: Deatra RobinsonKevin  Herman M.D.   On: 04/10/2019 00:29   Ct Pelvis Wo Contrast  Result Date: 04/10/2019 CLINICAL DATA:  Pelvic/right hip pain after fall. EXAM: CT PELVIS WITHOUT CONTRAST TECHNIQUE: Multidetector CT imaging of the pelvis was performed following the standard protocol without intravenous contrast. COMPARISON:  Radiographs earlier this day. FINDINGS: Patient had difficulty tolerating the exam there is motion artifact. Urinary Tract: Urinary bladder is nondistended. Distal ureters are decompressed. Bowel: Colonic diverticulosis. No acute pelvic bowel findings. Normal appendix is visualized. Vascular/Lymphatic: Aorto bi-iliac atherosclerosis. Multiple prominent bilateral lymph nodes, not enlarged by size criteria. Reproductive:  Uterus is unremarkable. No adnexal mass. Other:  No pelvic free fluid. Musculoskeletal: Advanced osteoarthritis of both hips with acetabulum and femoral head osteophytes and subchondral cystic change. Many of the  osteophytes are fragmented with possible intra-articular bodies. No acute pelvic fracture. Pubic rami are intact. Pubic symphysis and sacroiliac joints are congruent. Mild bilateral sacroiliac joint degenerative change. No confluent intramuscular hematoma. Patchy subcutaneous edema both lateral hips. Gluteal granulomas. IMPRESSION: 1. No acute fracture of the pelvis or hips. 2. Advanced osteoarthritis of both hips. Aortic Atherosclerosis (ICD10-I70.0). Electronically Signed   By: Narda RutherfordMelanie  Sanford M.D.   On: 04/10/2019 00:05   Ct Knee Right Wo Contrast  Result Date: 04/10/2019 CLINICAL DATA:  Right knee pain after fall. EXAM: CT OF THE  KNEE WITHOUT CONTRAST TECHNIQUE: Multidetector CT imaging of the RIGHT knee was performed according to the standard protocol. Multiplanar CT image reconstructions were also generated. COMPARISON:  Radiograph earlier this day. FINDINGS: Patient had difficulty tolerating seem. There is motion artifact. Bones/Joint/Cartilage Possible nondisplaced fracture through the lateral patellar facet, image 92 series 4. No additional fracture. Moderate osteoarthritis with tricompartmental spurring. Spurring of the tibial spines. Medial tibiofemoral and patellofemoral joint space narrowing. Small knee joint effusion with some internal calcifications, chronic. No lipohemarthrosis. Ligaments Suboptimally assessed by CT. Muscles and Tendons No confluent muscle hematoma. Quadriceps and patellar tendons are intact. Soft tissues Generalized soft tissue edema. Vascular calcifications. IMPRESSION: 1. Possible nondisplaced fracture through the lateral patellar facet. 2. Moderate osteoarthritis. Small knee joint effusion with chronic debris. Electronically Signed   By: Narda RutherfordMelanie  Sanford M.D.   On: 04/10/2019 00:09   Dg Knee Complete 4 Views Right  Result Date: 04/09/2019 CLINICAL DATA:  85 year old female with fall and right knee pain. EXAM: RIGHT KNEE - COMPLETE 4+ VIEW COMPARISON:  None. FINDINGS:  There is no acute fracture or dislocation. The bones are osteopenic. Mild arthritic changes and meniscal chondrocalcinosis. No joint effusion. The soft tissues are unremarkable. IMPRESSION: No acute fracture or dislocation. Electronically Signed   By: Elgie CollardArash  Radparvar M.D.   On: 04/09/2019 22:41   Dg Hip Unilat With Pelvis Min 4 Views Right  Result Date: 04/09/2019 CLINICAL DATA:  Fall on stairs with right hip pain, initial encounter EXAM: DG HIP (WITH OR WITHOUT PELVIS) 4+V RIGHT COMPARISON:  None. FINDINGS: Pelvic ring is intact. Degenerative changes of the hip joints are noted bilaterally. No definitive fracture or dislocation is noted. No soft tissue abnormality is seen. IMPRESSION: Degenerative change without definitive fracture. Electronically Signed   By: Alcide CleverMark  Lukens M.D.   On: 04/09/2019 22:30    Procedures Procedures (including critical care time)  Medications Ordered in ED Medications  morphine 4 MG/ML injection 4 mg (4 mg Intravenous Given 04/09/19 2100)  ondansetron (ZOFRAN) injection 4 mg (4 mg Intravenous Given 04/09/19 2101)  Initial Impression / Assessment and Plan / ED Course  I have reviewed the triage vital signs and the nursing notes.  Pertinent labs & imaging results that were available during my care of the patient were reviewed by me and considered in my medical decision making (see chart for details).        Plain film xrays negative for acute injury.  Pt with continued pain, unable to sit or stand, despite IV pain medication.  She was sent back for Ct imaging to better access for injury. Patellar fracture noted laterally. Lumbar/ pelvis negative.  She was placed in a knee immobilizer, script for walker given for prn use.  Pt advised f/u with ortho, referrals given.  Hydrocodone with caution re sedation.  Ice and heat tx discussed.  Pt understands to call for ortho f/u re patellar injury.  She was able to ambulate, pain during standing from seated position, but was  able to walk fairly comfortably once in the knee immobilizer.   Final Clinical Impressions(s) / ED Diagnoses   Final diagnoses:  Fall  DDD (degenerative disc disease), lumbar  Other closed fracture of right patella, initial encounter    ED Discharge Orders         Ordered    HYDROcodone-acetaminophen (NORCO/VICODIN) 5-325 MG tablet  Every 4 hours PRN     04/10/19 0049    Walker standard     04/10/19 0134           Burgess AmorIdol, Avangeline Stockburger, PA-C 04/11/19 1237    Donnetta Hutchingook, Brian, MD 04/13/19 1115

## 2019-04-10 NOTE — ED Notes (Signed)
Spoke with pt's daughter susan([phone]) about discharge and prescriptions. She verbalized understanding

## 2019-04-10 NOTE — Discharge Instructions (Signed)
Wear the knee immobilizer on your right leg to protect your knee.  Ice and elevation will help with pain and swelling.  You may take the medication prescribed for pain relief if needed.  Use caution as this medication will make you drowsy.  I recommend taking either 1/2 to 1 tablet every 4 hours as needed for pain.  Ice applied to your lower back will also be helpful as much as possible over the next 2 days.  You may add a heating pad to your back for 20 minutes several times daily after this initial 2 days using ice.

## 2020-01-03 DIAGNOSIS — L97909 Non-pressure chronic ulcer of unspecified part of unspecified lower leg with unspecified severity: Secondary | ICD-10-CM | POA: Diagnosis not present

## 2020-01-03 DIAGNOSIS — L0390 Cellulitis, unspecified: Secondary | ICD-10-CM | POA: Diagnosis not present

## 2020-01-03 DIAGNOSIS — R52 Pain, unspecified: Secondary | ICD-10-CM | POA: Diagnosis not present

## 2020-01-18 DIAGNOSIS — R0902 Hypoxemia: Secondary | ICD-10-CM | POA: Diagnosis not present

## 2020-01-18 DIAGNOSIS — I959 Hypotension, unspecified: Secondary | ICD-10-CM | POA: Diagnosis not present

## 2020-01-18 DIAGNOSIS — R0689 Other abnormalities of breathing: Secondary | ICD-10-CM | POA: Diagnosis not present

## 2020-01-18 DIAGNOSIS — J80 Acute respiratory distress syndrome: Secondary | ICD-10-CM | POA: Diagnosis not present

## 2020-01-18 DIAGNOSIS — A419 Sepsis, unspecified organism: Secondary | ICD-10-CM | POA: Diagnosis not present

## 2020-01-18 DIAGNOSIS — Z87891 Personal history of nicotine dependence: Secondary | ICD-10-CM | POA: Diagnosis not present

## 2020-01-18 DIAGNOSIS — R112 Nausea with vomiting, unspecified: Secondary | ICD-10-CM | POA: Diagnosis not present

## 2020-01-18 DIAGNOSIS — T17920A Food in respiratory tract, part unspecified causing asphyxiation, initial encounter: Secondary | ICD-10-CM | POA: Diagnosis not present

## 2020-01-19 DIAGNOSIS — R404 Transient alteration of awareness: Secondary | ICD-10-CM | POA: Diagnosis not present

## 2020-01-19 DIAGNOSIS — S91301A Unspecified open wound, right foot, initial encounter: Secondary | ICD-10-CM | POA: Diagnosis not present

## 2020-01-28 DIAGNOSIS — L03115 Cellulitis of right lower limb: Secondary | ICD-10-CM | POA: Diagnosis not present

## 2020-01-28 DIAGNOSIS — I509 Heart failure, unspecified: Secondary | ICD-10-CM | POA: Diagnosis not present

## 2020-01-28 DIAGNOSIS — I10 Essential (primary) hypertension: Secondary | ICD-10-CM | POA: Diagnosis not present

## 2020-01-28 DIAGNOSIS — R000 Tachycardia, unspecified: Secondary | ICD-10-CM | POA: Diagnosis not present

## 2020-01-28 DIAGNOSIS — I130 Hypertensive heart and chronic kidney disease with heart failure and stage 1 through stage 4 chronic kidney disease, or unspecified chronic kidney disease: Secondary | ICD-10-CM | POA: Diagnosis not present

## 2020-01-28 DIAGNOSIS — L03116 Cellulitis of left lower limb: Secondary | ICD-10-CM | POA: Diagnosis not present

## 2020-01-28 DIAGNOSIS — N1832 Chronic kidney disease, stage 3b: Secondary | ICD-10-CM | POA: Diagnosis not present

## 2020-01-28 DIAGNOSIS — D500 Iron deficiency anemia secondary to blood loss (chronic): Secondary | ICD-10-CM | POA: Diagnosis not present

## 2020-01-28 DIAGNOSIS — R404 Transient alteration of awareness: Secondary | ICD-10-CM | POA: Diagnosis not present

## 2020-01-28 DIAGNOSIS — R4182 Altered mental status, unspecified: Secondary | ICD-10-CM | POA: Diagnosis not present

## 2020-01-28 DIAGNOSIS — K5730 Diverticulosis of large intestine without perforation or abscess without bleeding: Secondary | ICD-10-CM | POA: Diagnosis not present

## 2020-01-28 DIAGNOSIS — R52 Pain, unspecified: Secondary | ICD-10-CM | POA: Diagnosis not present

## 2020-01-28 DIAGNOSIS — A419 Sepsis, unspecified organism: Secondary | ICD-10-CM | POA: Diagnosis not present

## 2020-01-28 DIAGNOSIS — R0902 Hypoxemia: Secondary | ICD-10-CM | POA: Diagnosis not present

## 2020-01-28 DIAGNOSIS — R10819 Abdominal tenderness, unspecified site: Secondary | ICD-10-CM | POA: Diagnosis not present

## 2020-01-28 DIAGNOSIS — L0100 Impetigo, unspecified: Secondary | ICD-10-CM | POA: Diagnosis not present

## 2020-01-28 DIAGNOSIS — N179 Acute kidney failure, unspecified: Secondary | ICD-10-CM | POA: Diagnosis not present

## 2020-01-28 DIAGNOSIS — F201 Disorganized schizophrenia: Secondary | ICD-10-CM | POA: Diagnosis not present

## 2020-01-28 DIAGNOSIS — R112 Nausea with vomiting, unspecified: Secondary | ICD-10-CM | POA: Diagnosis not present

## 2020-01-28 DIAGNOSIS — I350 Nonrheumatic aortic (valve) stenosis: Secondary | ICD-10-CM | POA: Diagnosis not present

## 2020-01-28 DIAGNOSIS — D649 Anemia, unspecified: Secondary | ICD-10-CM | POA: Diagnosis not present

## 2020-01-28 DIAGNOSIS — F320 Major depressive disorder, single episode, mild: Secondary | ICD-10-CM | POA: Diagnosis not present

## 2020-01-28 DIAGNOSIS — L0390 Cellulitis, unspecified: Secondary | ICD-10-CM | POA: Diagnosis not present

## 2020-02-04 DIAGNOSIS — L03115 Cellulitis of right lower limb: Secondary | ICD-10-CM | POA: Diagnosis not present

## 2020-02-04 DIAGNOSIS — L03116 Cellulitis of left lower limb: Secondary | ICD-10-CM | POA: Diagnosis not present

## 2020-02-04 DIAGNOSIS — L02611 Cutaneous abscess of right foot: Secondary | ICD-10-CM | POA: Diagnosis not present

## 2020-02-04 DIAGNOSIS — I872 Venous insufficiency (chronic) (peripheral): Secondary | ICD-10-CM | POA: Diagnosis not present

## 2020-02-04 DIAGNOSIS — L03119 Cellulitis of unspecified part of limb: Secondary | ICD-10-CM | POA: Diagnosis not present

## 2020-02-04 DIAGNOSIS — I878 Other specified disorders of veins: Secondary | ICD-10-CM | POA: Diagnosis not present

## 2020-02-04 DIAGNOSIS — L02419 Cutaneous abscess of limb, unspecified: Secondary | ICD-10-CM | POA: Diagnosis not present

## 2020-02-04 DIAGNOSIS — I350 Nonrheumatic aortic (valve) stenosis: Secondary | ICD-10-CM | POA: Diagnosis not present

## 2020-02-04 DIAGNOSIS — Z87891 Personal history of nicotine dependence: Secondary | ICD-10-CM | POA: Diagnosis not present

## 2020-02-04 DIAGNOSIS — L02612 Cutaneous abscess of left foot: Secondary | ICD-10-CM | POA: Diagnosis not present

## 2020-02-20 DIAGNOSIS — B351 Tinea unguium: Secondary | ICD-10-CM | POA: Diagnosis not present

## 2020-02-20 DIAGNOSIS — R600 Localized edema: Secondary | ICD-10-CM | POA: Diagnosis not present

## 2020-02-20 DIAGNOSIS — I739 Peripheral vascular disease, unspecified: Secondary | ICD-10-CM | POA: Diagnosis not present

## 2020-02-28 DIAGNOSIS — M7989 Other specified soft tissue disorders: Secondary | ICD-10-CM | POA: Diagnosis not present

## 2020-02-28 DIAGNOSIS — L03115 Cellulitis of right lower limb: Secondary | ICD-10-CM | POA: Diagnosis not present

## 2020-02-28 DIAGNOSIS — R0902 Hypoxemia: Secondary | ICD-10-CM | POA: Diagnosis not present

## 2020-02-28 DIAGNOSIS — I10 Essential (primary) hypertension: Secondary | ICD-10-CM | POA: Diagnosis not present

## 2020-02-28 DIAGNOSIS — I959 Hypotension, unspecified: Secondary | ICD-10-CM | POA: Diagnosis not present

## 2020-02-28 DIAGNOSIS — I5032 Chronic diastolic (congestive) heart failure: Secondary | ICD-10-CM | POA: Diagnosis not present

## 2020-02-28 DIAGNOSIS — F201 Disorganized schizophrenia: Secondary | ICD-10-CM | POA: Diagnosis not present

## 2020-02-28 DIAGNOSIS — L02419 Cutaneous abscess of limb, unspecified: Secondary | ICD-10-CM | POA: Diagnosis not present

## 2020-02-28 DIAGNOSIS — L03116 Cellulitis of left lower limb: Secondary | ICD-10-CM | POA: Diagnosis not present

## 2020-02-28 DIAGNOSIS — R609 Edema, unspecified: Secondary | ICD-10-CM | POA: Diagnosis not present

## 2020-02-28 DIAGNOSIS — D631 Anemia in chronic kidney disease: Secondary | ICD-10-CM | POA: Diagnosis not present

## 2020-02-28 DIAGNOSIS — T148XXA Other injury of unspecified body region, initial encounter: Secondary | ICD-10-CM | POA: Diagnosis not present

## 2020-02-28 DIAGNOSIS — I6320 Cerebral infarction due to unspecified occlusion or stenosis of unspecified precerebral arteries: Secondary | ICD-10-CM | POA: Diagnosis not present

## 2020-02-28 DIAGNOSIS — E669 Obesity, unspecified: Secondary | ICD-10-CM | POA: Diagnosis not present

## 2020-02-28 DIAGNOSIS — L02415 Cutaneous abscess of right lower limb: Secondary | ICD-10-CM | POA: Diagnosis not present

## 2020-02-28 DIAGNOSIS — G629 Polyneuropathy, unspecified: Secondary | ICD-10-CM | POA: Diagnosis not present

## 2020-02-28 DIAGNOSIS — I350 Nonrheumatic aortic (valve) stenosis: Secondary | ICD-10-CM | POA: Diagnosis not present

## 2020-02-28 DIAGNOSIS — I130 Hypertensive heart and chronic kidney disease with heart failure and stage 1 through stage 4 chronic kidney disease, or unspecified chronic kidney disease: Secondary | ICD-10-CM | POA: Diagnosis not present

## 2020-02-28 DIAGNOSIS — R457 State of emotional shock and stress, unspecified: Secondary | ICD-10-CM | POA: Diagnosis not present

## 2020-02-28 DIAGNOSIS — L03119 Cellulitis of unspecified part of limb: Secondary | ICD-10-CM | POA: Diagnosis not present

## 2020-02-28 DIAGNOSIS — L0103 Bullous impetigo: Secondary | ICD-10-CM | POA: Diagnosis not present

## 2020-02-28 DIAGNOSIS — G4733 Obstructive sleep apnea (adult) (pediatric): Secondary | ICD-10-CM | POA: Diagnosis not present

## 2020-02-29 DIAGNOSIS — I6320 Cerebral infarction due to unspecified occlusion or stenosis of unspecified precerebral arteries: Secondary | ICD-10-CM | POA: Diagnosis not present

## 2020-02-29 DIAGNOSIS — I350 Nonrheumatic aortic (valve) stenosis: Secondary | ICD-10-CM | POA: Diagnosis not present

## 2020-02-29 DIAGNOSIS — L03119 Cellulitis of unspecified part of limb: Secondary | ICD-10-CM | POA: Diagnosis not present

## 2020-02-29 DIAGNOSIS — I10 Essential (primary) hypertension: Secondary | ICD-10-CM | POA: Diagnosis not present

## 2020-02-29 DIAGNOSIS — G4733 Obstructive sleep apnea (adult) (pediatric): Secondary | ICD-10-CM | POA: Diagnosis not present

## 2020-02-29 DIAGNOSIS — F201 Disorganized schizophrenia: Secondary | ICD-10-CM | POA: Diagnosis not present

## 2020-02-29 DIAGNOSIS — L02419 Cutaneous abscess of limb, unspecified: Secondary | ICD-10-CM | POA: Diagnosis not present

## 2020-02-29 DIAGNOSIS — L0103 Bullous impetigo: Secondary | ICD-10-CM | POA: Diagnosis not present

## 2020-03-01 DIAGNOSIS — L0103 Bullous impetigo: Secondary | ICD-10-CM | POA: Diagnosis not present

## 2020-03-01 DIAGNOSIS — L02419 Cutaneous abscess of limb, unspecified: Secondary | ICD-10-CM | POA: Diagnosis not present

## 2020-03-01 DIAGNOSIS — I350 Nonrheumatic aortic (valve) stenosis: Secondary | ICD-10-CM | POA: Diagnosis not present

## 2020-03-01 DIAGNOSIS — F201 Disorganized schizophrenia: Secondary | ICD-10-CM | POA: Diagnosis not present

## 2020-03-01 DIAGNOSIS — I6320 Cerebral infarction due to unspecified occlusion or stenosis of unspecified precerebral arteries: Secondary | ICD-10-CM | POA: Diagnosis not present

## 2020-03-01 DIAGNOSIS — L03119 Cellulitis of unspecified part of limb: Secondary | ICD-10-CM | POA: Diagnosis not present

## 2020-03-01 DIAGNOSIS — I10 Essential (primary) hypertension: Secondary | ICD-10-CM | POA: Diagnosis not present

## 2020-03-01 DIAGNOSIS — G4733 Obstructive sleep apnea (adult) (pediatric): Secondary | ICD-10-CM | POA: Diagnosis not present

## 2020-03-02 DIAGNOSIS — I10 Essential (primary) hypertension: Secondary | ICD-10-CM | POA: Diagnosis not present

## 2020-03-02 DIAGNOSIS — L02419 Cutaneous abscess of limb, unspecified: Secondary | ICD-10-CM | POA: Diagnosis not present

## 2020-03-02 DIAGNOSIS — L0103 Bullous impetigo: Secondary | ICD-10-CM | POA: Diagnosis not present

## 2020-03-02 DIAGNOSIS — I350 Nonrheumatic aortic (valve) stenosis: Secondary | ICD-10-CM | POA: Diagnosis not present

## 2020-03-02 DIAGNOSIS — L03119 Cellulitis of unspecified part of limb: Secondary | ICD-10-CM | POA: Diagnosis not present

## 2020-03-03 DIAGNOSIS — I130 Hypertensive heart and chronic kidney disease with heart failure and stage 1 through stage 4 chronic kidney disease, or unspecified chronic kidney disease: Secondary | ICD-10-CM | POA: Diagnosis not present

## 2020-03-03 DIAGNOSIS — L02612 Cutaneous abscess of left foot: Secondary | ICD-10-CM | POA: Diagnosis not present

## 2020-03-03 DIAGNOSIS — I5030 Unspecified diastolic (congestive) heart failure: Secondary | ICD-10-CM | POA: Diagnosis not present

## 2020-03-03 DIAGNOSIS — A4101 Sepsis due to Methicillin susceptible Staphylococcus aureus: Secondary | ICD-10-CM | POA: Diagnosis not present

## 2020-03-03 DIAGNOSIS — N1832 Chronic kidney disease, stage 3b: Secondary | ICD-10-CM | POA: Diagnosis not present

## 2020-03-03 DIAGNOSIS — L03115 Cellulitis of right lower limb: Secondary | ICD-10-CM | POA: Diagnosis not present

## 2020-03-03 DIAGNOSIS — D631 Anemia in chronic kidney disease: Secondary | ICD-10-CM | POA: Diagnosis not present

## 2020-03-03 DIAGNOSIS — L0103 Bullous impetigo: Secondary | ICD-10-CM | POA: Diagnosis not present

## 2020-03-03 DIAGNOSIS — G4733 Obstructive sleep apnea (adult) (pediatric): Secondary | ICD-10-CM | POA: Diagnosis not present

## 2020-03-06 DIAGNOSIS — G4733 Obstructive sleep apnea (adult) (pediatric): Secondary | ICD-10-CM | POA: Diagnosis not present

## 2020-03-06 DIAGNOSIS — D631 Anemia in chronic kidney disease: Secondary | ICD-10-CM | POA: Diagnosis not present

## 2020-03-06 DIAGNOSIS — L0103 Bullous impetigo: Secondary | ICD-10-CM | POA: Diagnosis not present

## 2020-03-06 DIAGNOSIS — I130 Hypertensive heart and chronic kidney disease with heart failure and stage 1 through stage 4 chronic kidney disease, or unspecified chronic kidney disease: Secondary | ICD-10-CM | POA: Diagnosis not present

## 2020-03-06 DIAGNOSIS — L02612 Cutaneous abscess of left foot: Secondary | ICD-10-CM | POA: Diagnosis not present

## 2020-03-06 DIAGNOSIS — I5030 Unspecified diastolic (congestive) heart failure: Secondary | ICD-10-CM | POA: Diagnosis not present

## 2020-03-06 DIAGNOSIS — N1832 Chronic kidney disease, stage 3b: Secondary | ICD-10-CM | POA: Diagnosis not present

## 2020-03-06 DIAGNOSIS — A4101 Sepsis due to Methicillin susceptible Staphylococcus aureus: Secondary | ICD-10-CM | POA: Diagnosis not present

## 2020-03-06 DIAGNOSIS — L03115 Cellulitis of right lower limb: Secondary | ICD-10-CM | POA: Diagnosis not present

## 2020-03-09 DIAGNOSIS — I5030 Unspecified diastolic (congestive) heart failure: Secondary | ICD-10-CM | POA: Diagnosis not present

## 2020-03-09 DIAGNOSIS — L03115 Cellulitis of right lower limb: Secondary | ICD-10-CM | POA: Diagnosis not present

## 2020-03-09 DIAGNOSIS — L02612 Cutaneous abscess of left foot: Secondary | ICD-10-CM | POA: Diagnosis not present

## 2020-03-09 DIAGNOSIS — D631 Anemia in chronic kidney disease: Secondary | ICD-10-CM | POA: Diagnosis not present

## 2020-03-09 DIAGNOSIS — G4733 Obstructive sleep apnea (adult) (pediatric): Secondary | ICD-10-CM | POA: Diagnosis not present

## 2020-03-09 DIAGNOSIS — I130 Hypertensive heart and chronic kidney disease with heart failure and stage 1 through stage 4 chronic kidney disease, or unspecified chronic kidney disease: Secondary | ICD-10-CM | POA: Diagnosis not present

## 2020-03-09 DIAGNOSIS — A4101 Sepsis due to Methicillin susceptible Staphylococcus aureus: Secondary | ICD-10-CM | POA: Diagnosis not present

## 2020-03-09 DIAGNOSIS — N1832 Chronic kidney disease, stage 3b: Secondary | ICD-10-CM | POA: Diagnosis not present

## 2020-03-09 DIAGNOSIS — L0103 Bullous impetigo: Secondary | ICD-10-CM | POA: Diagnosis not present

## 2020-03-12 DIAGNOSIS — L03115 Cellulitis of right lower limb: Secondary | ICD-10-CM | POA: Diagnosis not present

## 2020-03-12 DIAGNOSIS — A4101 Sepsis due to Methicillin susceptible Staphylococcus aureus: Secondary | ICD-10-CM | POA: Diagnosis not present

## 2020-03-12 DIAGNOSIS — G4733 Obstructive sleep apnea (adult) (pediatric): Secondary | ICD-10-CM | POA: Diagnosis not present

## 2020-03-12 DIAGNOSIS — L0103 Bullous impetigo: Secondary | ICD-10-CM | POA: Diagnosis not present

## 2020-03-12 DIAGNOSIS — L02612 Cutaneous abscess of left foot: Secondary | ICD-10-CM | POA: Diagnosis not present

## 2020-03-12 DIAGNOSIS — D631 Anemia in chronic kidney disease: Secondary | ICD-10-CM | POA: Diagnosis not present

## 2020-03-12 DIAGNOSIS — I130 Hypertensive heart and chronic kidney disease with heart failure and stage 1 through stage 4 chronic kidney disease, or unspecified chronic kidney disease: Secondary | ICD-10-CM | POA: Diagnosis not present

## 2020-03-12 DIAGNOSIS — N1832 Chronic kidney disease, stage 3b: Secondary | ICD-10-CM | POA: Diagnosis not present

## 2020-03-12 DIAGNOSIS — I5030 Unspecified diastolic (congestive) heart failure: Secondary | ICD-10-CM | POA: Diagnosis not present

## 2020-03-16 DIAGNOSIS — D631 Anemia in chronic kidney disease: Secondary | ICD-10-CM | POA: Diagnosis not present

## 2020-03-16 DIAGNOSIS — L02612 Cutaneous abscess of left foot: Secondary | ICD-10-CM | POA: Diagnosis not present

## 2020-03-16 DIAGNOSIS — G4733 Obstructive sleep apnea (adult) (pediatric): Secondary | ICD-10-CM | POA: Diagnosis not present

## 2020-03-16 DIAGNOSIS — N1832 Chronic kidney disease, stage 3b: Secondary | ICD-10-CM | POA: Diagnosis not present

## 2020-03-16 DIAGNOSIS — L0103 Bullous impetigo: Secondary | ICD-10-CM | POA: Diagnosis not present

## 2020-03-16 DIAGNOSIS — A4101 Sepsis due to Methicillin susceptible Staphylococcus aureus: Secondary | ICD-10-CM | POA: Diagnosis not present

## 2020-03-16 DIAGNOSIS — I130 Hypertensive heart and chronic kidney disease with heart failure and stage 1 through stage 4 chronic kidney disease, or unspecified chronic kidney disease: Secondary | ICD-10-CM | POA: Diagnosis not present

## 2020-03-16 DIAGNOSIS — L03115 Cellulitis of right lower limb: Secondary | ICD-10-CM | POA: Diagnosis not present

## 2020-03-16 DIAGNOSIS — I5030 Unspecified diastolic (congestive) heart failure: Secondary | ICD-10-CM | POA: Diagnosis not present

## 2020-03-19 DIAGNOSIS — A4101 Sepsis due to Methicillin susceptible Staphylococcus aureus: Secondary | ICD-10-CM | POA: Diagnosis not present

## 2020-03-19 DIAGNOSIS — I5030 Unspecified diastolic (congestive) heart failure: Secondary | ICD-10-CM | POA: Diagnosis not present

## 2020-03-19 DIAGNOSIS — L02612 Cutaneous abscess of left foot: Secondary | ICD-10-CM | POA: Diagnosis not present

## 2020-03-19 DIAGNOSIS — G4733 Obstructive sleep apnea (adult) (pediatric): Secondary | ICD-10-CM | POA: Diagnosis not present

## 2020-03-19 DIAGNOSIS — D631 Anemia in chronic kidney disease: Secondary | ICD-10-CM | POA: Diagnosis not present

## 2020-03-19 DIAGNOSIS — L03115 Cellulitis of right lower limb: Secondary | ICD-10-CM | POA: Diagnosis not present

## 2020-03-19 DIAGNOSIS — N1832 Chronic kidney disease, stage 3b: Secondary | ICD-10-CM | POA: Diagnosis not present

## 2020-03-19 DIAGNOSIS — L0103 Bullous impetigo: Secondary | ICD-10-CM | POA: Diagnosis not present

## 2020-03-19 DIAGNOSIS — I130 Hypertensive heart and chronic kidney disease with heart failure and stage 1 through stage 4 chronic kidney disease, or unspecified chronic kidney disease: Secondary | ICD-10-CM | POA: Diagnosis not present

## 2020-03-20 DIAGNOSIS — I5030 Unspecified diastolic (congestive) heart failure: Secondary | ICD-10-CM | POA: Diagnosis not present

## 2020-03-20 DIAGNOSIS — I130 Hypertensive heart and chronic kidney disease with heart failure and stage 1 through stage 4 chronic kidney disease, or unspecified chronic kidney disease: Secondary | ICD-10-CM | POA: Diagnosis not present

## 2020-03-20 DIAGNOSIS — L0103 Bullous impetigo: Secondary | ICD-10-CM | POA: Diagnosis not present

## 2020-03-20 DIAGNOSIS — L02612 Cutaneous abscess of left foot: Secondary | ICD-10-CM | POA: Diagnosis not present

## 2020-03-20 DIAGNOSIS — G4733 Obstructive sleep apnea (adult) (pediatric): Secondary | ICD-10-CM | POA: Diagnosis not present

## 2020-03-20 DIAGNOSIS — D631 Anemia in chronic kidney disease: Secondary | ICD-10-CM | POA: Diagnosis not present

## 2020-03-20 DIAGNOSIS — L03115 Cellulitis of right lower limb: Secondary | ICD-10-CM | POA: Diagnosis not present

## 2020-03-20 DIAGNOSIS — N1832 Chronic kidney disease, stage 3b: Secondary | ICD-10-CM | POA: Diagnosis not present

## 2020-03-20 DIAGNOSIS — A4101 Sepsis due to Methicillin susceptible Staphylococcus aureus: Secondary | ICD-10-CM | POA: Diagnosis not present

## 2020-03-26 DIAGNOSIS — K5730 Diverticulosis of large intestine without perforation or abscess without bleeding: Secondary | ICD-10-CM | POA: Diagnosis not present

## 2020-03-26 DIAGNOSIS — F201 Disorganized schizophrenia: Secondary | ICD-10-CM | POA: Diagnosis not present

## 2020-03-26 DIAGNOSIS — I6320 Cerebral infarction due to unspecified occlusion or stenosis of unspecified precerebral arteries: Secondary | ICD-10-CM | POA: Diagnosis not present

## 2020-03-26 DIAGNOSIS — N1832 Chronic kidney disease, stage 3b: Secondary | ICD-10-CM | POA: Diagnosis not present

## 2020-03-26 DIAGNOSIS — I10 Essential (primary) hypertension: Secondary | ICD-10-CM | POA: Diagnosis not present

## 2020-03-26 DIAGNOSIS — I5030 Unspecified diastolic (congestive) heart failure: Secondary | ICD-10-CM | POA: Diagnosis not present

## 2020-03-26 DIAGNOSIS — R109 Unspecified abdominal pain: Secondary | ICD-10-CM | POA: Diagnosis not present

## 2020-03-26 DIAGNOSIS — R918 Other nonspecific abnormal finding of lung field: Secondary | ICD-10-CM | POA: Diagnosis not present

## 2020-03-26 DIAGNOSIS — A4101 Sepsis due to Methicillin susceptible Staphylococcus aureus: Secondary | ICD-10-CM | POA: Diagnosis not present

## 2020-03-26 DIAGNOSIS — R0689 Other abnormalities of breathing: Secondary | ICD-10-CM | POA: Diagnosis not present

## 2020-03-26 DIAGNOSIS — R509 Fever, unspecified: Secondary | ICD-10-CM | POA: Diagnosis not present

## 2020-03-26 DIAGNOSIS — J984 Other disorders of lung: Secondary | ICD-10-CM | POA: Diagnosis not present

## 2020-03-26 DIAGNOSIS — I130 Hypertensive heart and chronic kidney disease with heart failure and stage 1 through stage 4 chronic kidney disease, or unspecified chronic kidney disease: Secondary | ICD-10-CM | POA: Diagnosis not present

## 2020-03-26 DIAGNOSIS — N183 Chronic kidney disease, stage 3 unspecified: Secondary | ICD-10-CM | POA: Diagnosis not present

## 2020-03-26 DIAGNOSIS — I878 Other specified disorders of veins: Secondary | ICD-10-CM | POA: Diagnosis not present

## 2020-03-26 DIAGNOSIS — R0602 Shortness of breath: Secondary | ICD-10-CM | POA: Diagnosis not present

## 2020-03-26 DIAGNOSIS — J90 Pleural effusion, not elsewhere classified: Secondary | ICD-10-CM | POA: Diagnosis not present

## 2020-03-26 DIAGNOSIS — F0390 Unspecified dementia without behavioral disturbance: Secondary | ICD-10-CM | POA: Diagnosis not present

## 2020-03-26 DIAGNOSIS — I350 Nonrheumatic aortic (valve) stenosis: Secondary | ICD-10-CM | POA: Diagnosis not present

## 2020-03-26 DIAGNOSIS — L02612 Cutaneous abscess of left foot: Secondary | ICD-10-CM | POA: Diagnosis not present

## 2020-03-26 DIAGNOSIS — R064 Hyperventilation: Secondary | ICD-10-CM | POA: Diagnosis not present

## 2020-03-26 DIAGNOSIS — G4733 Obstructive sleep apnea (adult) (pediatric): Secondary | ICD-10-CM | POA: Diagnosis not present

## 2020-03-26 DIAGNOSIS — R0902 Hypoxemia: Secondary | ICD-10-CM | POA: Diagnosis not present

## 2020-03-26 DIAGNOSIS — N390 Urinary tract infection, site not specified: Secondary | ICD-10-CM | POA: Diagnosis not present

## 2020-03-26 DIAGNOSIS — D631 Anemia in chronic kidney disease: Secondary | ICD-10-CM | POA: Diagnosis not present

## 2020-03-26 DIAGNOSIS — Z6841 Body Mass Index (BMI) 40.0 and over, adult: Secondary | ICD-10-CM | POA: Diagnosis not present

## 2020-03-26 DIAGNOSIS — R590 Localized enlarged lymph nodes: Secondary | ICD-10-CM | POA: Diagnosis not present

## 2020-03-26 DIAGNOSIS — Z87891 Personal history of nicotine dependence: Secondary | ICD-10-CM | POA: Diagnosis not present

## 2020-03-26 DIAGNOSIS — Z8673 Personal history of transient ischemic attack (TIA), and cerebral infarction without residual deficits: Secondary | ICD-10-CM | POA: Diagnosis not present

## 2020-03-26 DIAGNOSIS — R404 Transient alteration of awareness: Secondary | ICD-10-CM | POA: Diagnosis not present

## 2020-03-26 DIAGNOSIS — I5033 Acute on chronic diastolic (congestive) heart failure: Secondary | ICD-10-CM | POA: Diagnosis not present

## 2020-03-26 DIAGNOSIS — R069 Unspecified abnormalities of breathing: Secondary | ICD-10-CM | POA: Diagnosis not present

## 2020-03-26 DIAGNOSIS — Z20822 Contact with and (suspected) exposure to covid-19: Secondary | ICD-10-CM | POA: Diagnosis not present

## 2020-03-26 DIAGNOSIS — A419 Sepsis, unspecified organism: Secondary | ICD-10-CM | POA: Diagnosis not present

## 2020-03-26 DIAGNOSIS — N1831 Chronic kidney disease, stage 3a: Secondary | ICD-10-CM | POA: Diagnosis not present

## 2020-03-26 DIAGNOSIS — L0103 Bullous impetigo: Secondary | ICD-10-CM | POA: Diagnosis not present

## 2020-03-26 DIAGNOSIS — L03115 Cellulitis of right lower limb: Secondary | ICD-10-CM | POA: Diagnosis not present

## 2020-03-26 DIAGNOSIS — R911 Solitary pulmonary nodule: Secondary | ICD-10-CM | POA: Diagnosis not present

## 2020-03-26 DIAGNOSIS — R4182 Altered mental status, unspecified: Secondary | ICD-10-CM | POA: Diagnosis not present

## 2020-03-26 DIAGNOSIS — J189 Pneumonia, unspecified organism: Secondary | ICD-10-CM | POA: Diagnosis not present

## 2020-03-27 DIAGNOSIS — D631 Anemia in chronic kidney disease: Secondary | ICD-10-CM | POA: Diagnosis not present

## 2020-03-27 DIAGNOSIS — N390 Urinary tract infection, site not specified: Secondary | ICD-10-CM | POA: Diagnosis not present

## 2020-03-27 DIAGNOSIS — N1831 Chronic kidney disease, stage 3a: Secondary | ICD-10-CM | POA: Diagnosis not present

## 2020-03-27 DIAGNOSIS — N183 Chronic kidney disease, stage 3 unspecified: Secondary | ICD-10-CM | POA: Diagnosis not present

## 2020-03-27 DIAGNOSIS — R0602 Shortness of breath: Secondary | ICD-10-CM | POA: Diagnosis not present

## 2020-03-27 DIAGNOSIS — R590 Localized enlarged lymph nodes: Secondary | ICD-10-CM | POA: Diagnosis not present

## 2020-03-27 DIAGNOSIS — I5033 Acute on chronic diastolic (congestive) heart failure: Secondary | ICD-10-CM | POA: Diagnosis not present

## 2020-03-27 DIAGNOSIS — G4733 Obstructive sleep apnea (adult) (pediatric): Secondary | ICD-10-CM | POA: Diagnosis not present

## 2020-03-27 DIAGNOSIS — R109 Unspecified abdominal pain: Secondary | ICD-10-CM | POA: Diagnosis not present

## 2020-03-27 DIAGNOSIS — J189 Pneumonia, unspecified organism: Secondary | ICD-10-CM | POA: Diagnosis not present

## 2020-03-27 DIAGNOSIS — I130 Hypertensive heart and chronic kidney disease with heart failure and stage 1 through stage 4 chronic kidney disease, or unspecified chronic kidney disease: Secondary | ICD-10-CM | POA: Diagnosis not present

## 2020-03-27 DIAGNOSIS — Z6841 Body Mass Index (BMI) 40.0 and over, adult: Secondary | ICD-10-CM | POA: Diagnosis not present

## 2020-03-27 DIAGNOSIS — J984 Other disorders of lung: Secondary | ICD-10-CM | POA: Diagnosis not present

## 2020-03-27 DIAGNOSIS — F0390 Unspecified dementia without behavioral disturbance: Secondary | ICD-10-CM | POA: Diagnosis not present

## 2020-03-27 DIAGNOSIS — R4182 Altered mental status, unspecified: Secondary | ICD-10-CM | POA: Diagnosis not present

## 2020-03-27 DIAGNOSIS — I10 Essential (primary) hypertension: Secondary | ICD-10-CM | POA: Diagnosis not present

## 2020-03-27 DIAGNOSIS — R509 Fever, unspecified: Secondary | ICD-10-CM | POA: Diagnosis not present

## 2020-03-27 DIAGNOSIS — Z8673 Personal history of transient ischemic attack (TIA), and cerebral infarction without residual deficits: Secondary | ICD-10-CM | POA: Diagnosis not present

## 2020-03-27 DIAGNOSIS — A419 Sepsis, unspecified organism: Secondary | ICD-10-CM | POA: Diagnosis not present

## 2020-03-27 DIAGNOSIS — Z20822 Contact with and (suspected) exposure to covid-19: Secondary | ICD-10-CM | POA: Diagnosis not present

## 2020-03-27 DIAGNOSIS — J90 Pleural effusion, not elsewhere classified: Secondary | ICD-10-CM | POA: Diagnosis not present

## 2020-03-27 DIAGNOSIS — K5730 Diverticulosis of large intestine without perforation or abscess without bleeding: Secondary | ICD-10-CM | POA: Diagnosis not present

## 2020-03-27 DIAGNOSIS — I6320 Cerebral infarction due to unspecified occlusion or stenosis of unspecified precerebral arteries: Secondary | ICD-10-CM | POA: Diagnosis not present

## 2020-03-27 DIAGNOSIS — F201 Disorganized schizophrenia: Secondary | ICD-10-CM | POA: Diagnosis not present

## 2020-03-27 DIAGNOSIS — I878 Other specified disorders of veins: Secondary | ICD-10-CM | POA: Diagnosis not present

## 2020-03-27 DIAGNOSIS — I350 Nonrheumatic aortic (valve) stenosis: Secondary | ICD-10-CM | POA: Diagnosis not present

## 2020-04-02 DIAGNOSIS — I5030 Unspecified diastolic (congestive) heart failure: Secondary | ICD-10-CM | POA: Diagnosis not present

## 2020-04-02 DIAGNOSIS — N1832 Chronic kidney disease, stage 3b: Secondary | ICD-10-CM | POA: Diagnosis not present

## 2020-04-02 DIAGNOSIS — A4101 Sepsis due to Methicillin susceptible Staphylococcus aureus: Secondary | ICD-10-CM | POA: Diagnosis not present

## 2020-04-02 DIAGNOSIS — I130 Hypertensive heart and chronic kidney disease with heart failure and stage 1 through stage 4 chronic kidney disease, or unspecified chronic kidney disease: Secondary | ICD-10-CM | POA: Diagnosis not present

## 2020-04-02 DIAGNOSIS — L0103 Bullous impetigo: Secondary | ICD-10-CM | POA: Diagnosis not present

## 2020-04-02 DIAGNOSIS — G4733 Obstructive sleep apnea (adult) (pediatric): Secondary | ICD-10-CM | POA: Diagnosis not present

## 2020-04-02 DIAGNOSIS — L03115 Cellulitis of right lower limb: Secondary | ICD-10-CM | POA: Diagnosis not present

## 2020-04-02 DIAGNOSIS — D631 Anemia in chronic kidney disease: Secondary | ICD-10-CM | POA: Diagnosis not present

## 2020-04-02 DIAGNOSIS — L02612 Cutaneous abscess of left foot: Secondary | ICD-10-CM | POA: Diagnosis not present

## 2020-04-03 ENCOUNTER — Other Ambulatory Visit: Payer: Self-pay

## 2020-04-03 NOTE — Patient Outreach (Signed)
[facility]) Care Management  04/03/2020  Dagny Fiorentino 1933/04/27 [id number]     Transition of Care Referral  Referral Date: 04/03/2020 Referral Source: [employer] Discharge Report Date of Discharge: 04/01/20 Facility: [facility] Insurance: [facility]   Referral recieved. Upon chart review noted that patient is followed by [facility] for primary care and not in [facility] for [facility] services.     Plan: RN CM will close case at this time.    Antionette Fairy, RN,BSN,CCM [facility] Care Management Telephonic Care Management Coordinator Direct Phone: [phone] Toll Free: [phone] Fax: [phone]

## 2020-04-07 DIAGNOSIS — I5030 Unspecified diastolic (congestive) heart failure: Secondary | ICD-10-CM | POA: Diagnosis not present

## 2020-04-07 DIAGNOSIS — A4101 Sepsis due to Methicillin susceptible Staphylococcus aureus: Secondary | ICD-10-CM | POA: Diagnosis not present

## 2020-04-07 DIAGNOSIS — L0103 Bullous impetigo: Secondary | ICD-10-CM | POA: Diagnosis not present

## 2020-04-07 DIAGNOSIS — D631 Anemia in chronic kidney disease: Secondary | ICD-10-CM | POA: Diagnosis not present

## 2020-04-07 DIAGNOSIS — N1832 Chronic kidney disease, stage 3b: Secondary | ICD-10-CM | POA: Diagnosis not present

## 2020-04-07 DIAGNOSIS — I130 Hypertensive heart and chronic kidney disease with heart failure and stage 1 through stage 4 chronic kidney disease, or unspecified chronic kidney disease: Secondary | ICD-10-CM | POA: Diagnosis not present

## 2020-04-07 DIAGNOSIS — L02612 Cutaneous abscess of left foot: Secondary | ICD-10-CM | POA: Diagnosis not present

## 2020-04-07 DIAGNOSIS — G4733 Obstructive sleep apnea (adult) (pediatric): Secondary | ICD-10-CM | POA: Diagnosis not present

## 2020-04-07 DIAGNOSIS — L03115 Cellulitis of right lower limb: Secondary | ICD-10-CM | POA: Diagnosis not present

## 2020-04-11 DIAGNOSIS — I5030 Unspecified diastolic (congestive) heart failure: Secondary | ICD-10-CM | POA: Diagnosis not present

## 2020-04-11 DIAGNOSIS — L03115 Cellulitis of right lower limb: Secondary | ICD-10-CM | POA: Diagnosis not present

## 2020-04-11 DIAGNOSIS — A4101 Sepsis due to Methicillin susceptible Staphylococcus aureus: Secondary | ICD-10-CM | POA: Diagnosis not present

## 2020-04-11 DIAGNOSIS — D631 Anemia in chronic kidney disease: Secondary | ICD-10-CM | POA: Diagnosis not present

## 2020-04-11 DIAGNOSIS — L0103 Bullous impetigo: Secondary | ICD-10-CM | POA: Diagnosis not present

## 2020-04-11 DIAGNOSIS — L02612 Cutaneous abscess of left foot: Secondary | ICD-10-CM | POA: Diagnosis not present

## 2020-04-11 DIAGNOSIS — I130 Hypertensive heart and chronic kidney disease with heart failure and stage 1 through stage 4 chronic kidney disease, or unspecified chronic kidney disease: Secondary | ICD-10-CM | POA: Diagnosis not present

## 2020-04-11 DIAGNOSIS — N1832 Chronic kidney disease, stage 3b: Secondary | ICD-10-CM | POA: Diagnosis not present

## 2020-04-11 DIAGNOSIS — G4733 Obstructive sleep apnea (adult) (pediatric): Secondary | ICD-10-CM | POA: Diagnosis not present

## 2020-04-14 DIAGNOSIS — L0103 Bullous impetigo: Secondary | ICD-10-CM | POA: Diagnosis not present

## 2020-04-14 DIAGNOSIS — I5030 Unspecified diastolic (congestive) heart failure: Secondary | ICD-10-CM | POA: Diagnosis not present

## 2020-04-14 DIAGNOSIS — A4101 Sepsis due to Methicillin susceptible Staphylococcus aureus: Secondary | ICD-10-CM | POA: Diagnosis not present

## 2020-04-14 DIAGNOSIS — I130 Hypertensive heart and chronic kidney disease with heart failure and stage 1 through stage 4 chronic kidney disease, or unspecified chronic kidney disease: Secondary | ICD-10-CM | POA: Diagnosis not present

## 2020-04-14 DIAGNOSIS — G4733 Obstructive sleep apnea (adult) (pediatric): Secondary | ICD-10-CM | POA: Diagnosis not present

## 2020-04-14 DIAGNOSIS — D631 Anemia in chronic kidney disease: Secondary | ICD-10-CM | POA: Diagnosis not present

## 2020-04-14 DIAGNOSIS — N1832 Chronic kidney disease, stage 3b: Secondary | ICD-10-CM | POA: Diagnosis not present

## 2020-04-14 DIAGNOSIS — L02612 Cutaneous abscess of left foot: Secondary | ICD-10-CM | POA: Diagnosis not present

## 2020-04-14 DIAGNOSIS — L03115 Cellulitis of right lower limb: Secondary | ICD-10-CM | POA: Diagnosis not present

## 2020-04-23 DIAGNOSIS — G4733 Obstructive sleep apnea (adult) (pediatric): Secondary | ICD-10-CM | POA: Diagnosis not present

## 2020-04-23 DIAGNOSIS — A4101 Sepsis due to Methicillin susceptible Staphylococcus aureus: Secondary | ICD-10-CM | POA: Diagnosis not present

## 2020-04-23 DIAGNOSIS — L0103 Bullous impetigo: Secondary | ICD-10-CM | POA: Diagnosis not present

## 2020-04-23 DIAGNOSIS — L03115 Cellulitis of right lower limb: Secondary | ICD-10-CM | POA: Diagnosis not present

## 2020-04-23 DIAGNOSIS — I130 Hypertensive heart and chronic kidney disease with heart failure and stage 1 through stage 4 chronic kidney disease, or unspecified chronic kidney disease: Secondary | ICD-10-CM | POA: Diagnosis not present

## 2020-04-23 DIAGNOSIS — I5030 Unspecified diastolic (congestive) heart failure: Secondary | ICD-10-CM | POA: Diagnosis not present

## 2020-04-23 DIAGNOSIS — D631 Anemia in chronic kidney disease: Secondary | ICD-10-CM | POA: Diagnosis not present

## 2020-04-23 DIAGNOSIS — L02612 Cutaneous abscess of left foot: Secondary | ICD-10-CM | POA: Diagnosis not present

## 2020-04-23 DIAGNOSIS — N1832 Chronic kidney disease, stage 3b: Secondary | ICD-10-CM | POA: Diagnosis not present

## 2020-04-25 DIAGNOSIS — L0103 Bullous impetigo: Secondary | ICD-10-CM | POA: Diagnosis not present

## 2020-04-25 DIAGNOSIS — I130 Hypertensive heart and chronic kidney disease with heart failure and stage 1 through stage 4 chronic kidney disease, or unspecified chronic kidney disease: Secondary | ICD-10-CM | POA: Diagnosis not present

## 2020-04-25 DIAGNOSIS — L03115 Cellulitis of right lower limb: Secondary | ICD-10-CM | POA: Diagnosis not present

## 2020-04-25 DIAGNOSIS — L02612 Cutaneous abscess of left foot: Secondary | ICD-10-CM | POA: Diagnosis not present

## 2020-04-25 DIAGNOSIS — A4101 Sepsis due to Methicillin susceptible Staphylococcus aureus: Secondary | ICD-10-CM | POA: Diagnosis not present

## 2020-04-25 DIAGNOSIS — N1832 Chronic kidney disease, stage 3b: Secondary | ICD-10-CM | POA: Diagnosis not present

## 2020-04-25 DIAGNOSIS — G4733 Obstructive sleep apnea (adult) (pediatric): Secondary | ICD-10-CM | POA: Diagnosis not present

## 2020-04-25 DIAGNOSIS — I5030 Unspecified diastolic (congestive) heart failure: Secondary | ICD-10-CM | POA: Diagnosis not present

## 2020-04-25 DIAGNOSIS — D631 Anemia in chronic kidney disease: Secondary | ICD-10-CM | POA: Diagnosis not present

## 2020-10-07 DIAGNOSIS — U071 COVID-19: Secondary | ICD-10-CM | POA: Diagnosis not present

## 2020-10-07 DIAGNOSIS — E871 Hypo-osmolality and hyponatremia: Secondary | ICD-10-CM | POA: Diagnosis not present

## 2020-10-07 DIAGNOSIS — M1712 Unilateral primary osteoarthritis, left knee: Secondary | ICD-10-CM | POA: Diagnosis not present

## 2020-10-07 DIAGNOSIS — J849 Interstitial pulmonary disease, unspecified: Secondary | ICD-10-CM | POA: Diagnosis not present

## 2020-10-07 DIAGNOSIS — R1312 Dysphagia, oropharyngeal phase: Secondary | ICD-10-CM | POA: Diagnosis not present

## 2020-10-07 DIAGNOSIS — I350 Nonrheumatic aortic (valve) stenosis: Secondary | ICD-10-CM | POA: Diagnosis not present

## 2020-10-07 DIAGNOSIS — R52 Pain, unspecified: Secondary | ICD-10-CM | POA: Diagnosis not present

## 2020-10-07 DIAGNOSIS — G9340 Encephalopathy, unspecified: Secondary | ICD-10-CM | POA: Diagnosis not present

## 2020-10-07 DIAGNOSIS — Z6841 Body Mass Index (BMI) 40.0 and over, adult: Secondary | ICD-10-CM | POA: Diagnosis not present

## 2020-10-07 DIAGNOSIS — J181 Lobar pneumonia, unspecified organism: Secondary | ICD-10-CM | POA: Diagnosis not present

## 2020-10-07 DIAGNOSIS — G4733 Obstructive sleep apnea (adult) (pediatric): Secondary | ICD-10-CM | POA: Diagnosis not present

## 2020-10-07 DIAGNOSIS — Z4682 Encounter for fitting and adjustment of non-vascular catheter: Secondary | ICD-10-CM | POA: Diagnosis not present

## 2020-10-07 DIAGNOSIS — N183 Chronic kidney disease, stage 3 unspecified: Secondary | ICD-10-CM | POA: Diagnosis not present

## 2020-10-07 DIAGNOSIS — J1282 Pneumonia due to coronavirus disease 2019: Secondary | ICD-10-CM | POA: Diagnosis not present

## 2020-10-07 DIAGNOSIS — R0689 Other abnormalities of breathing: Secondary | ICD-10-CM | POA: Diagnosis not present

## 2020-10-07 DIAGNOSIS — Z87891 Personal history of nicotine dependence: Secondary | ICD-10-CM | POA: Diagnosis not present

## 2020-10-07 DIAGNOSIS — R404 Transient alteration of awareness: Secondary | ICD-10-CM | POA: Diagnosis not present

## 2020-10-07 DIAGNOSIS — N189 Chronic kidney disease, unspecified: Secondary | ICD-10-CM | POA: Diagnosis not present

## 2020-10-07 DIAGNOSIS — M25562 Pain in left knee: Secondary | ICD-10-CM | POA: Diagnosis not present

## 2020-10-07 DIAGNOSIS — J9601 Acute respiratory failure with hypoxia: Secondary | ICD-10-CM | POA: Diagnosis not present

## 2020-10-07 DIAGNOSIS — F320 Major depressive disorder, single episode, mild: Secondary | ICD-10-CM | POA: Diagnosis not present

## 2020-10-07 DIAGNOSIS — J159 Unspecified bacterial pneumonia: Secondary | ICD-10-CM | POA: Diagnosis not present

## 2020-10-07 DIAGNOSIS — N179 Acute kidney failure, unspecified: Secondary | ICD-10-CM | POA: Diagnosis not present

## 2020-10-07 DIAGNOSIS — J189 Pneumonia, unspecified organism: Secondary | ICD-10-CM | POA: Diagnosis not present

## 2020-10-07 DIAGNOSIS — R0902 Hypoxemia: Secondary | ICD-10-CM | POA: Diagnosis not present

## 2020-10-07 DIAGNOSIS — A4189 Other specified sepsis: Secondary | ICD-10-CM | POA: Diagnosis not present

## 2020-10-07 DIAGNOSIS — N3000 Acute cystitis without hematuria: Secondary | ICD-10-CM | POA: Diagnosis not present

## 2020-10-07 DIAGNOSIS — A419 Sepsis, unspecified organism: Secondary | ICD-10-CM | POA: Diagnosis not present

## 2020-10-07 DIAGNOSIS — R4182 Altered mental status, unspecified: Secondary | ICD-10-CM | POA: Diagnosis not present

## 2020-10-08 DIAGNOSIS — G4733 Obstructive sleep apnea (adult) (pediatric): Secondary | ICD-10-CM | POA: Diagnosis not present

## 2020-10-08 DIAGNOSIS — I350 Nonrheumatic aortic (valve) stenosis: Secondary | ICD-10-CM | POA: Diagnosis not present

## 2020-10-08 DIAGNOSIS — N189 Chronic kidney disease, unspecified: Secondary | ICD-10-CM | POA: Diagnosis not present

## 2020-10-08 DIAGNOSIS — R4182 Altered mental status, unspecified: Secondary | ICD-10-CM | POA: Diagnosis not present

## 2020-10-08 DIAGNOSIS — J1282 Pneumonia due to coronavirus disease 2019: Secondary | ICD-10-CM | POA: Diagnosis not present

## 2020-10-08 DIAGNOSIS — N3000 Acute cystitis without hematuria: Secondary | ICD-10-CM | POA: Diagnosis not present

## 2020-10-08 DIAGNOSIS — U071 COVID-19: Secondary | ICD-10-CM | POA: Diagnosis not present

## 2020-10-08 DIAGNOSIS — J9601 Acute respiratory failure with hypoxia: Secondary | ICD-10-CM | POA: Diagnosis not present

## 2020-10-08 DIAGNOSIS — N179 Acute kidney failure, unspecified: Secondary | ICD-10-CM | POA: Diagnosis not present

## 2020-10-09 DIAGNOSIS — J1282 Pneumonia due to coronavirus disease 2019: Secondary | ICD-10-CM | POA: Diagnosis not present

## 2020-10-09 DIAGNOSIS — I350 Nonrheumatic aortic (valve) stenosis: Secondary | ICD-10-CM | POA: Diagnosis not present

## 2020-10-09 DIAGNOSIS — U071 COVID-19: Secondary | ICD-10-CM | POA: Diagnosis not present

## 2020-10-09 DIAGNOSIS — G4733 Obstructive sleep apnea (adult) (pediatric): Secondary | ICD-10-CM | POA: Diagnosis not present

## 2020-10-09 DIAGNOSIS — J849 Interstitial pulmonary disease, unspecified: Secondary | ICD-10-CM | POA: Diagnosis not present

## 2020-10-09 DIAGNOSIS — J181 Lobar pneumonia, unspecified organism: Secondary | ICD-10-CM | POA: Diagnosis not present

## 2020-10-09 DIAGNOSIS — R4182 Altered mental status, unspecified: Secondary | ICD-10-CM | POA: Diagnosis not present

## 2020-10-09 DIAGNOSIS — J9601 Acute respiratory failure with hypoxia: Secondary | ICD-10-CM | POA: Diagnosis not present

## 2020-10-09 DIAGNOSIS — J189 Pneumonia, unspecified organism: Secondary | ICD-10-CM | POA: Diagnosis not present

## 2020-10-09 DIAGNOSIS — N189 Chronic kidney disease, unspecified: Secondary | ICD-10-CM | POA: Diagnosis not present

## 2020-10-09 DIAGNOSIS — N179 Acute kidney failure, unspecified: Secondary | ICD-10-CM | POA: Diagnosis not present

## 2020-10-09 DIAGNOSIS — R1312 Dysphagia, oropharyngeal phase: Secondary | ICD-10-CM | POA: Diagnosis not present

## 2020-10-09 DIAGNOSIS — N3000 Acute cystitis without hematuria: Secondary | ICD-10-CM | POA: Diagnosis not present

## 2020-10-10 DIAGNOSIS — I350 Nonrheumatic aortic (valve) stenosis: Secondary | ICD-10-CM | POA: Diagnosis not present

## 2020-10-10 DIAGNOSIS — G4733 Obstructive sleep apnea (adult) (pediatric): Secondary | ICD-10-CM | POA: Diagnosis not present

## 2020-10-10 DIAGNOSIS — N189 Chronic kidney disease, unspecified: Secondary | ICD-10-CM | POA: Diagnosis not present

## 2020-10-10 DIAGNOSIS — N3000 Acute cystitis without hematuria: Secondary | ICD-10-CM | POA: Diagnosis not present

## 2020-10-10 DIAGNOSIS — N179 Acute kidney failure, unspecified: Secondary | ICD-10-CM | POA: Diagnosis not present

## 2020-10-10 DIAGNOSIS — U071 COVID-19: Secondary | ICD-10-CM | POA: Diagnosis not present

## 2020-10-10 DIAGNOSIS — J9601 Acute respiratory failure with hypoxia: Secondary | ICD-10-CM | POA: Diagnosis not present

## 2020-10-10 DIAGNOSIS — J1282 Pneumonia due to coronavirus disease 2019: Secondary | ICD-10-CM | POA: Diagnosis not present

## 2020-10-10 DIAGNOSIS — R4182 Altered mental status, unspecified: Secondary | ICD-10-CM | POA: Diagnosis not present

## 2020-10-11 DIAGNOSIS — N179 Acute kidney failure, unspecified: Secondary | ICD-10-CM | POA: Diagnosis not present

## 2020-10-11 DIAGNOSIS — J1282 Pneumonia due to coronavirus disease 2019: Secondary | ICD-10-CM | POA: Diagnosis not present

## 2020-10-11 DIAGNOSIS — I350 Nonrheumatic aortic (valve) stenosis: Secondary | ICD-10-CM | POA: Diagnosis not present

## 2020-10-11 DIAGNOSIS — N189 Chronic kidney disease, unspecified: Secondary | ICD-10-CM | POA: Diagnosis not present

## 2020-10-11 DIAGNOSIS — N3000 Acute cystitis without hematuria: Secondary | ICD-10-CM | POA: Diagnosis not present

## 2020-10-11 DIAGNOSIS — R4182 Altered mental status, unspecified: Secondary | ICD-10-CM | POA: Diagnosis not present

## 2020-10-11 DIAGNOSIS — U071 COVID-19: Secondary | ICD-10-CM | POA: Diagnosis not present

## 2020-10-11 DIAGNOSIS — J9601 Acute respiratory failure with hypoxia: Secondary | ICD-10-CM | POA: Diagnosis not present

## 2020-10-11 DIAGNOSIS — G4733 Obstructive sleep apnea (adult) (pediatric): Secondary | ICD-10-CM | POA: Diagnosis not present

## 2020-10-12 DIAGNOSIS — J1282 Pneumonia due to coronavirus disease 2019: Secondary | ICD-10-CM | POA: Diagnosis not present

## 2020-10-12 DIAGNOSIS — R4182 Altered mental status, unspecified: Secondary | ICD-10-CM | POA: Diagnosis not present

## 2020-10-12 DIAGNOSIS — N179 Acute kidney failure, unspecified: Secondary | ICD-10-CM | POA: Diagnosis not present

## 2020-10-12 DIAGNOSIS — N189 Chronic kidney disease, unspecified: Secondary | ICD-10-CM | POA: Diagnosis not present

## 2020-10-12 DIAGNOSIS — N3000 Acute cystitis without hematuria: Secondary | ICD-10-CM | POA: Diagnosis not present

## 2020-10-12 DIAGNOSIS — J9601 Acute respiratory failure with hypoxia: Secondary | ICD-10-CM | POA: Diagnosis not present

## 2020-10-12 DIAGNOSIS — G4733 Obstructive sleep apnea (adult) (pediatric): Secondary | ICD-10-CM | POA: Diagnosis not present

## 2020-10-12 DIAGNOSIS — Z4682 Encounter for fitting and adjustment of non-vascular catheter: Secondary | ICD-10-CM | POA: Diagnosis not present

## 2020-10-12 DIAGNOSIS — I350 Nonrheumatic aortic (valve) stenosis: Secondary | ICD-10-CM | POA: Diagnosis not present

## 2020-10-12 DIAGNOSIS — U071 COVID-19: Secondary | ICD-10-CM | POA: Diagnosis not present

## 2020-10-13 DIAGNOSIS — I350 Nonrheumatic aortic (valve) stenosis: Secondary | ICD-10-CM | POA: Diagnosis not present

## 2020-10-13 DIAGNOSIS — N3000 Acute cystitis without hematuria: Secondary | ICD-10-CM | POA: Diagnosis not present

## 2020-10-13 DIAGNOSIS — G4733 Obstructive sleep apnea (adult) (pediatric): Secondary | ICD-10-CM | POA: Diagnosis not present

## 2020-10-13 DIAGNOSIS — J1282 Pneumonia due to coronavirus disease 2019: Secondary | ICD-10-CM | POA: Diagnosis not present

## 2020-10-13 DIAGNOSIS — N189 Chronic kidney disease, unspecified: Secondary | ICD-10-CM | POA: Diagnosis not present

## 2020-10-13 DIAGNOSIS — R4182 Altered mental status, unspecified: Secondary | ICD-10-CM | POA: Diagnosis not present

## 2020-10-13 DIAGNOSIS — J9601 Acute respiratory failure with hypoxia: Secondary | ICD-10-CM | POA: Diagnosis not present

## 2020-10-13 DIAGNOSIS — U071 COVID-19: Secondary | ICD-10-CM | POA: Diagnosis not present

## 2020-10-13 DIAGNOSIS — N179 Acute kidney failure, unspecified: Secondary | ICD-10-CM | POA: Diagnosis not present

## 2020-10-14 DIAGNOSIS — J1282 Pneumonia due to coronavirus disease 2019: Secondary | ICD-10-CM | POA: Diagnosis not present

## 2020-10-14 DIAGNOSIS — N3000 Acute cystitis without hematuria: Secondary | ICD-10-CM | POA: Diagnosis not present

## 2020-10-14 DIAGNOSIS — R4182 Altered mental status, unspecified: Secondary | ICD-10-CM | POA: Diagnosis not present

## 2020-10-14 DIAGNOSIS — N189 Chronic kidney disease, unspecified: Secondary | ICD-10-CM | POA: Diagnosis not present

## 2020-10-14 DIAGNOSIS — U071 COVID-19: Secondary | ICD-10-CM | POA: Diagnosis not present

## 2020-10-14 DIAGNOSIS — G4733 Obstructive sleep apnea (adult) (pediatric): Secondary | ICD-10-CM | POA: Diagnosis not present

## 2020-10-14 DIAGNOSIS — I350 Nonrheumatic aortic (valve) stenosis: Secondary | ICD-10-CM | POA: Diagnosis not present

## 2020-10-14 DIAGNOSIS — N179 Acute kidney failure, unspecified: Secondary | ICD-10-CM | POA: Diagnosis not present

## 2020-10-14 DIAGNOSIS — J9601 Acute respiratory failure with hypoxia: Secondary | ICD-10-CM | POA: Diagnosis not present

## 2020-10-15 ENCOUNTER — Other Ambulatory Visit: Payer: Self-pay

## 2020-10-15 NOTE — Patient Outreach (Signed)
[facility]) Care Management  10/15/2020  Chloe Flynn [birth year] [id number]   Referral Date: 10/15/20 Referral Source: [employer] Report Date of Discharge: 11-03-20 Facility:  [facility] Insurance: [facility]   Referral received.  No outreach warranted at this time.  Patient deceased.    Plan: RN CM will close case.    Bary Leriche, RN, MSN [facility] Care Management Care Management Coordinator Direct Line [phone] Toll Free: [phone]  Fax: [phone]

## 2020-10-20 DEATH — deceased

## 2021-04-21 IMAGING — DX LUMBAR SPINE - COMPLETE 4+ VIEW
4 series · 4 of 4 positions shown · non-contrast
Comparison: None.

CLINICAL DATA: Recent fall on steps with low back pain, initial
encounter

EXAM:
LUMBAR SPINE - COMPLETE 4+ VIEW

[l-spine ap]
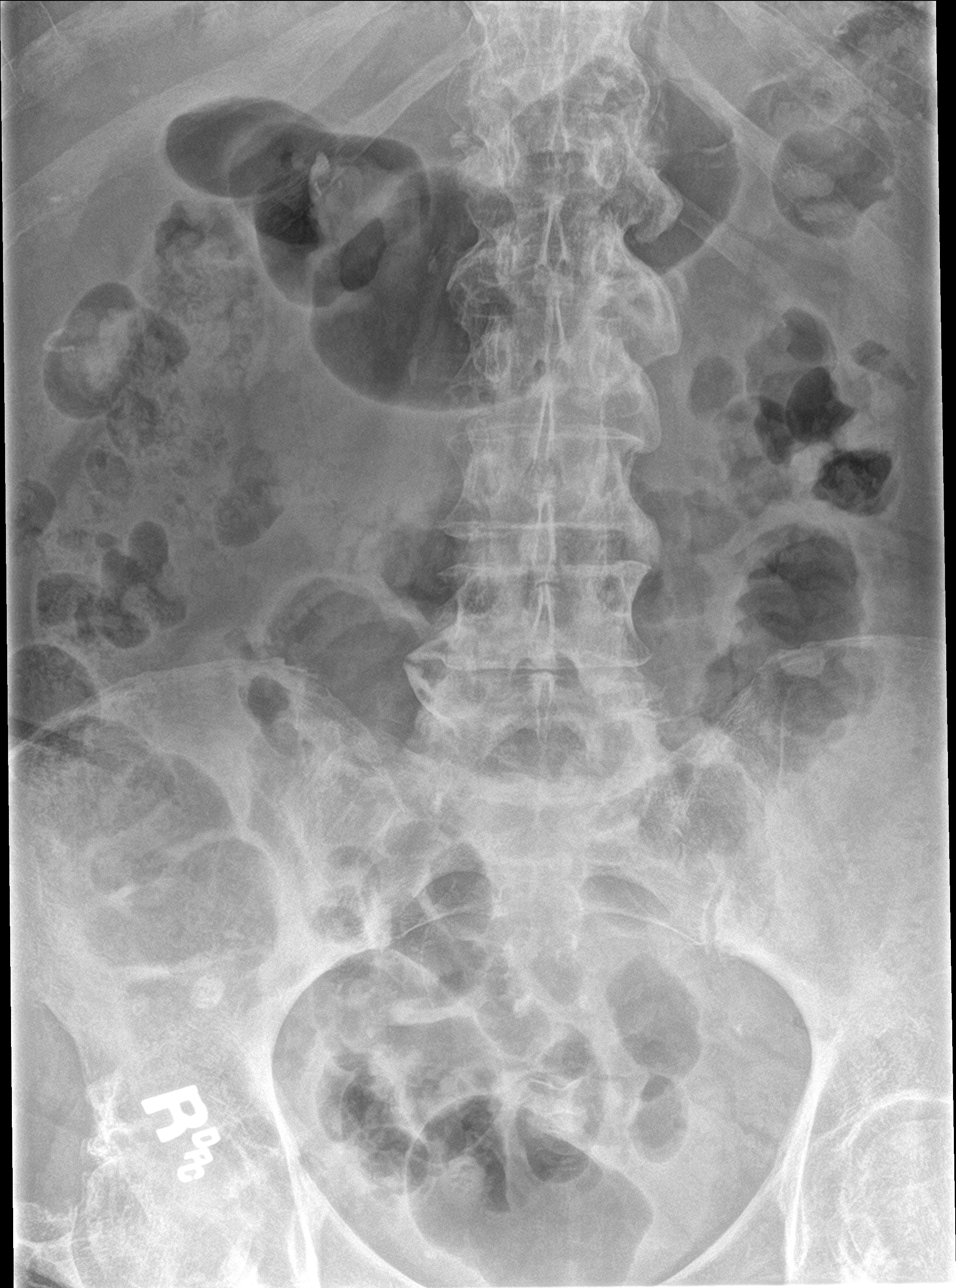

[l-spine obl (1 of 2)]
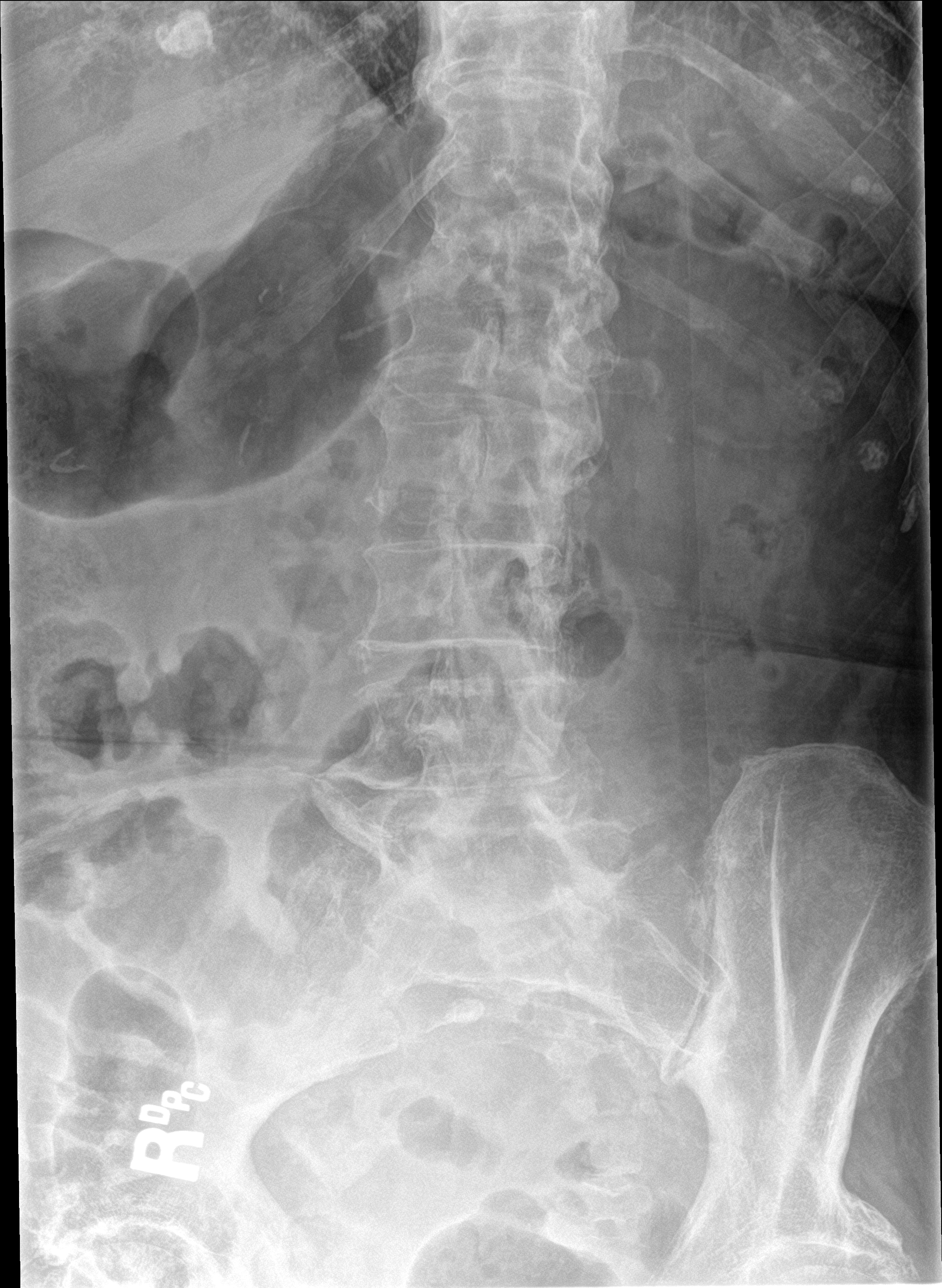

[l-spine obl (2 of 2)]
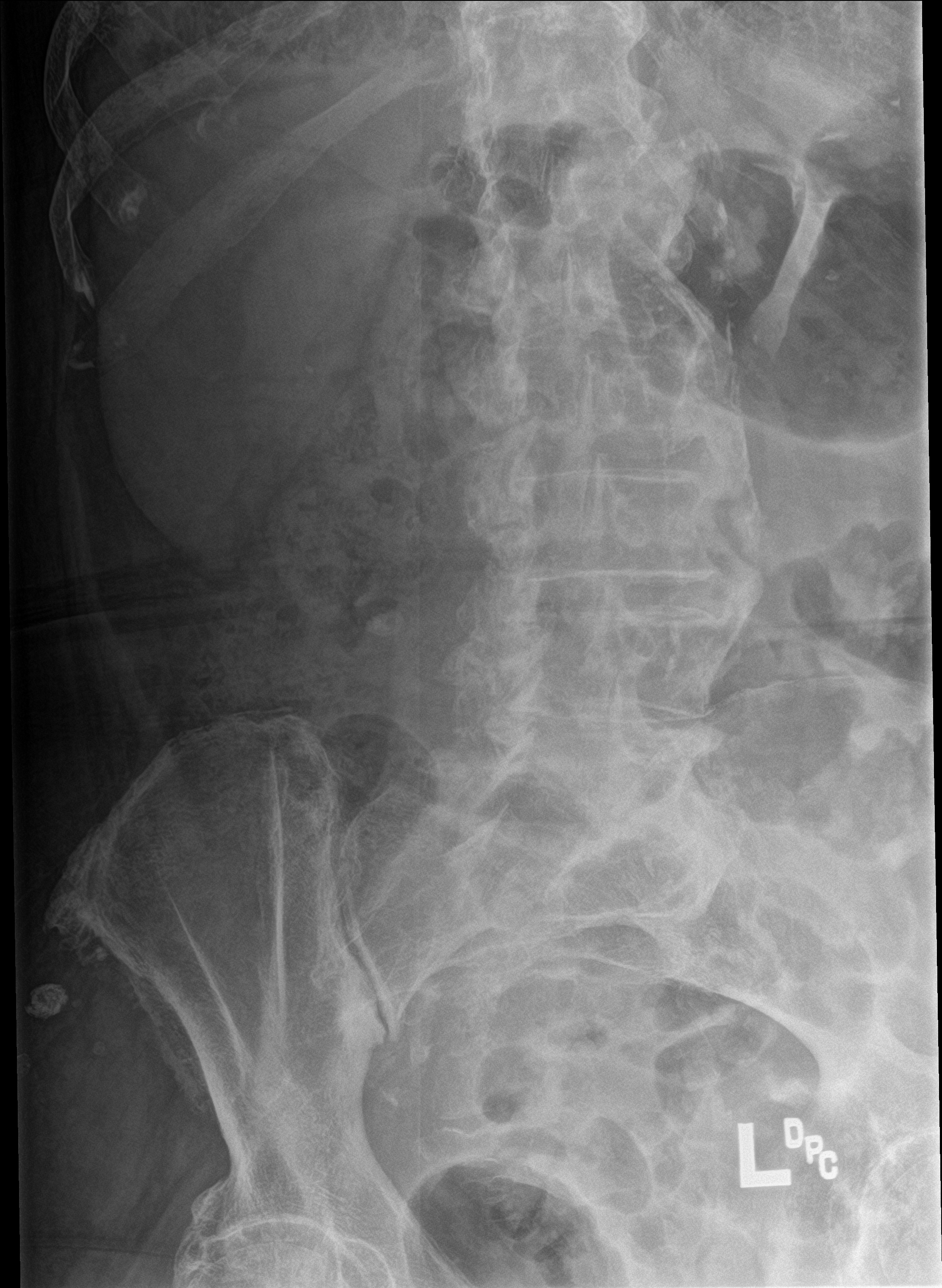

[l-spine lat]
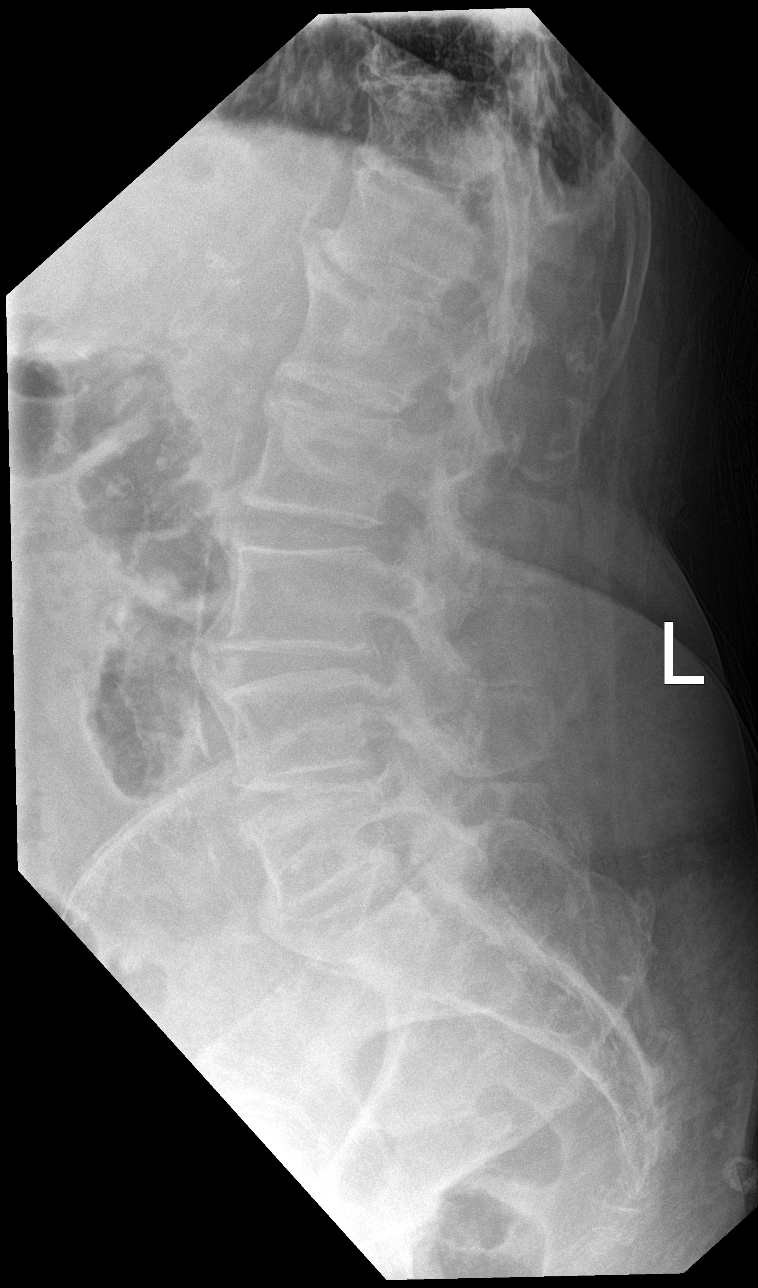

[4 of 4 positions shown; findings below may reference images not displayed]

FINDINGS: Five lumbar type vertebral bodies are well visualized. Vertebral
body height is well maintained. No anterolisthesis is noted.
Multilevel degenerative changes are noted. No soft tissue
abnormality is noted.
IMPRESSION: Degenerative change without acute abnormality.

## 2021-04-21 IMAGING — CT CT LUMBAR SPINE WITHOUT CONTRAST
3 series · 13 of 33 positions shown, 16 images · non-contrast
Comparison: None.

CLINICAL DATA: Back pain after falling

EXAM:
CT LUMBAR SPINE WITHOUT CONTRAST
TECHNIQUE: Multidetector CT imaging of the lumbar spine was performed without
intravenous contrast administration. Multiplanar CT image
reconstructions were also generated.

[Series 5: l spine soft · axial · 0.29mm/px · z∈[-19,+161]mm · 5 of 131 slices shown, 7 images]
[im 21/131  soft-tissue]
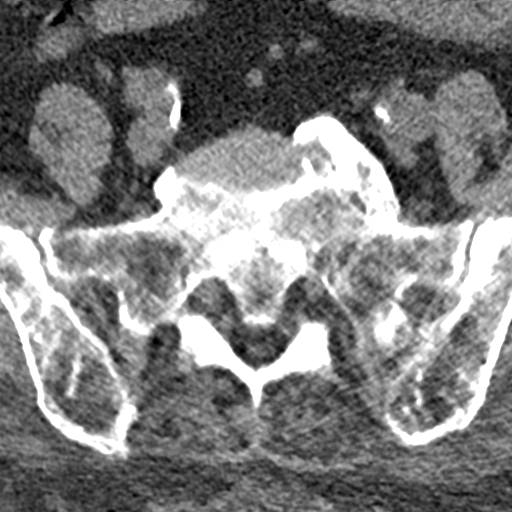
[im 21/131  bone]
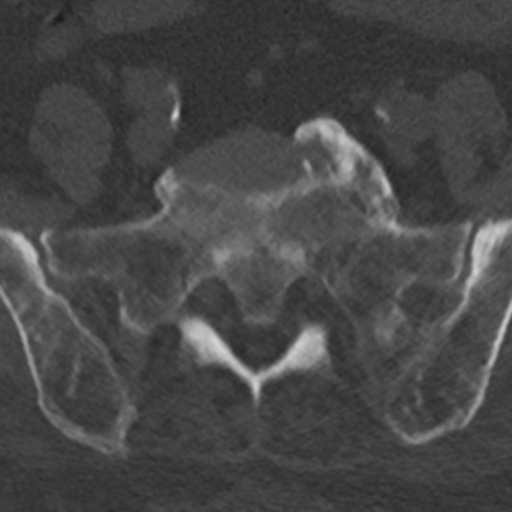
[im 41/131  bone]
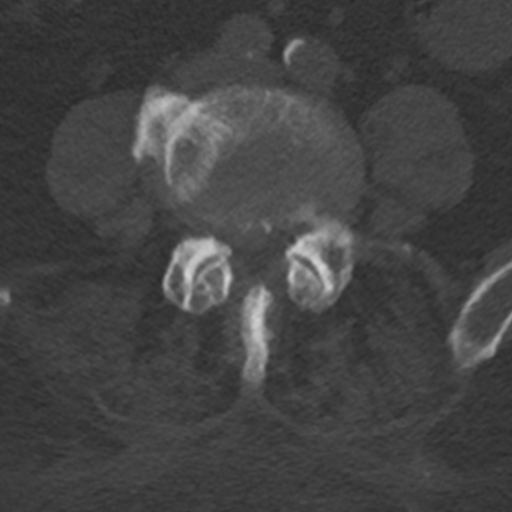
[im 71/131  bone]
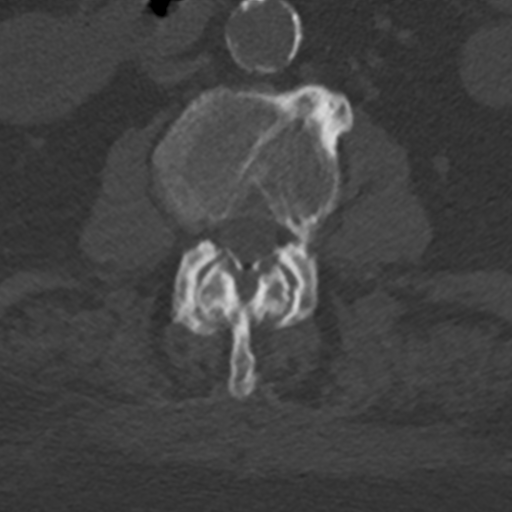
[im 91/131  bone]
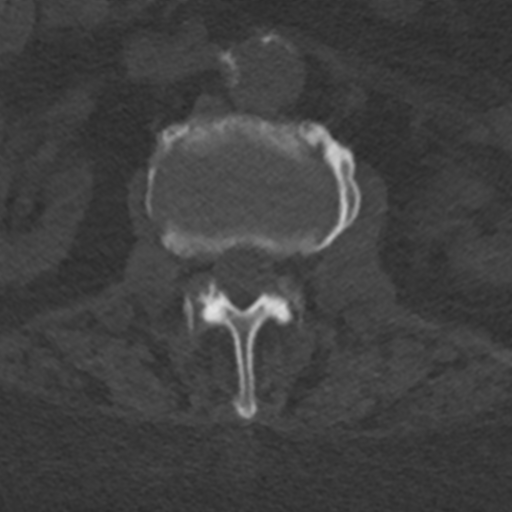
[im 111/131  soft-tissue]
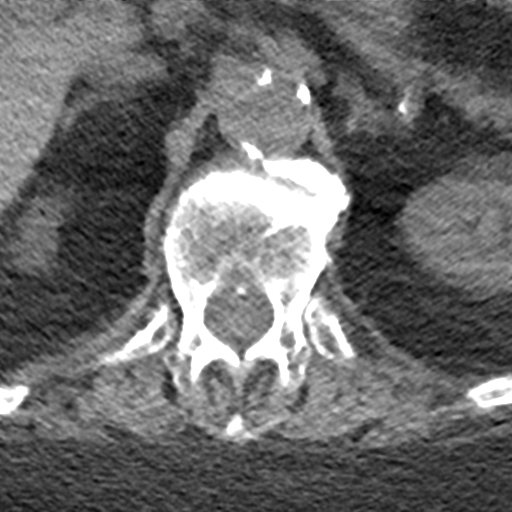
[im 111/131  bone]
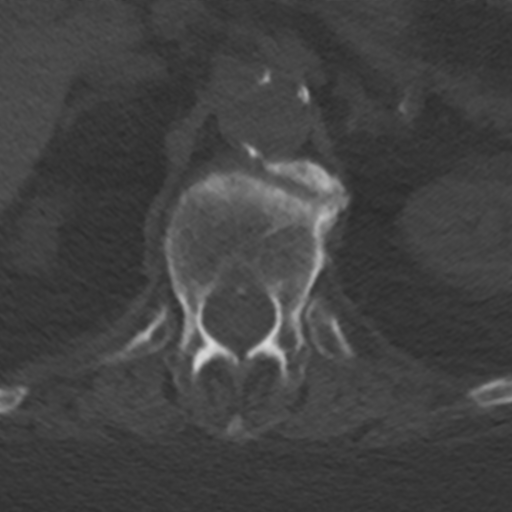

[Series 6: sagittal bone · sagittal · 0.38mm/px · 5 of 61 slices shown, 6 images]
[im 21/61  bone]
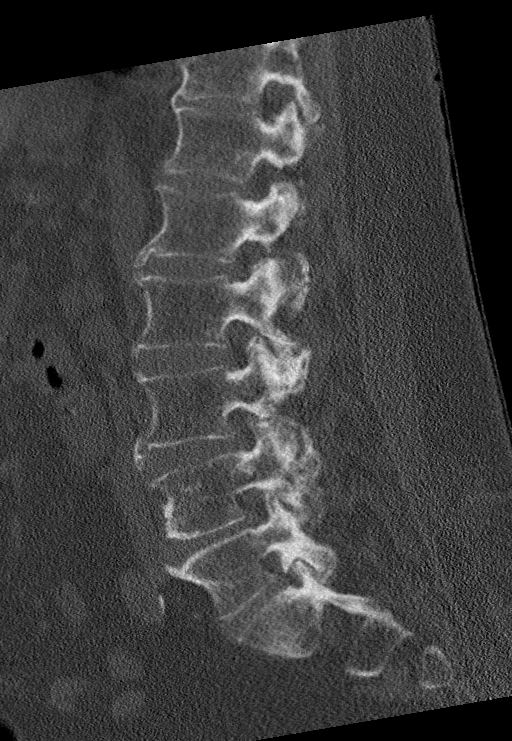
[im 26/61  bone]
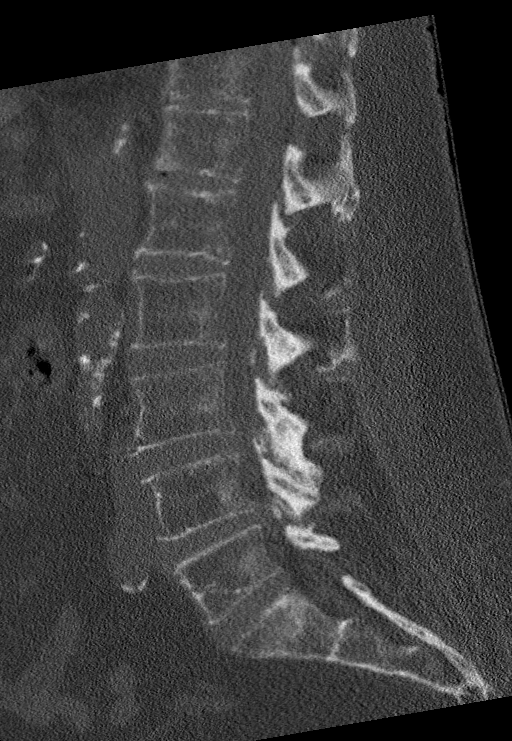
[im 31/61  soft-tissue]
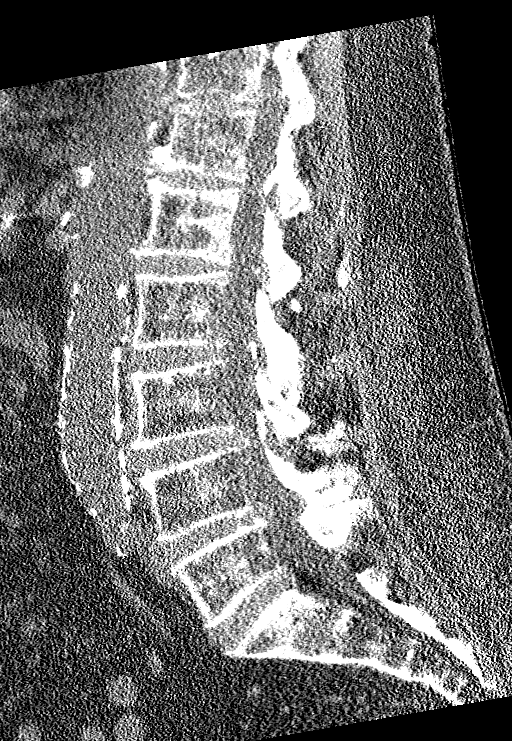
[im 31/61  bone]
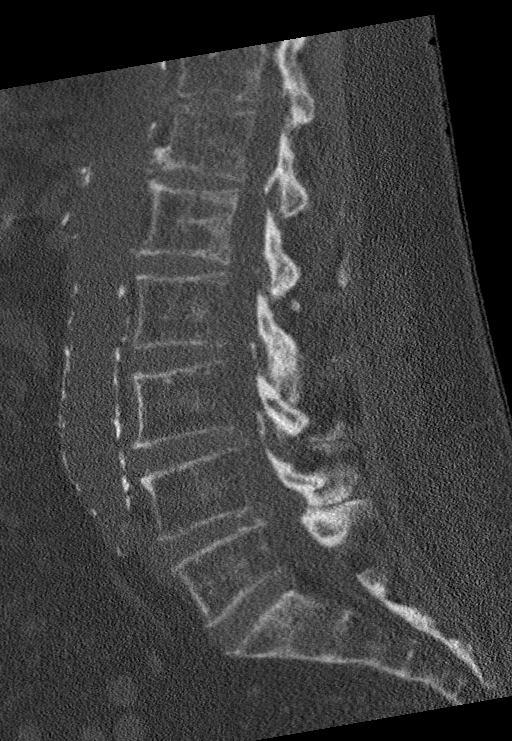
[im 36/61  bone]
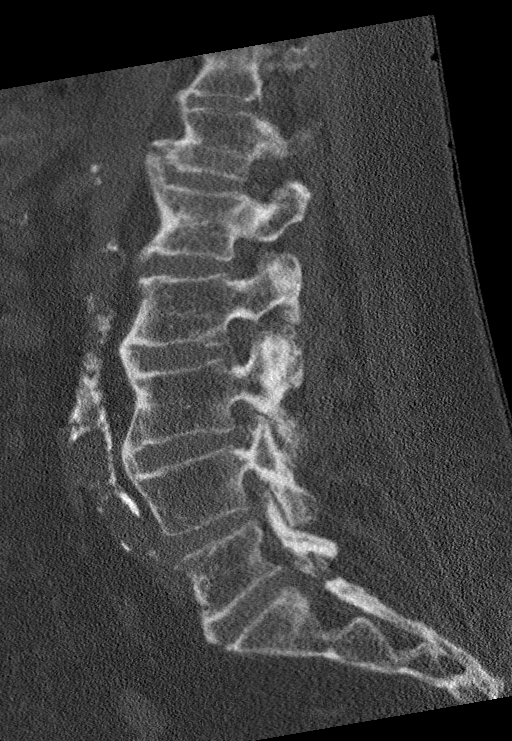
[im 41/61  bone]
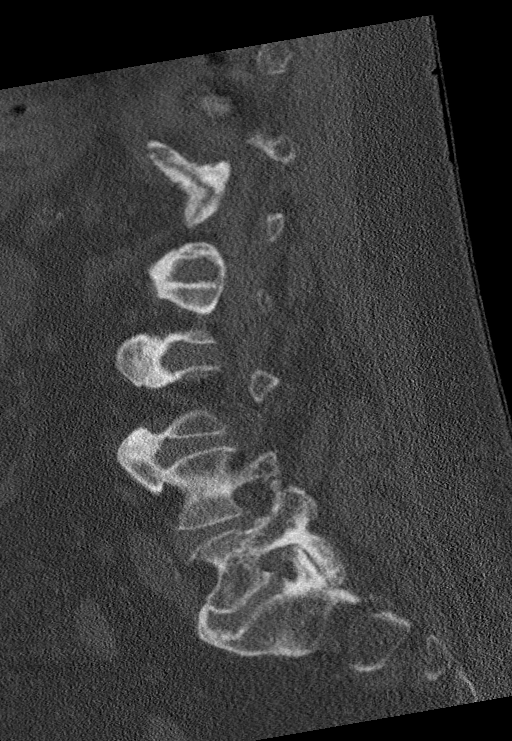

[Series 7: coronal bone · coronal · 0.38mm/px · 3 of 75 slices shown]
[im 15/75  bone]
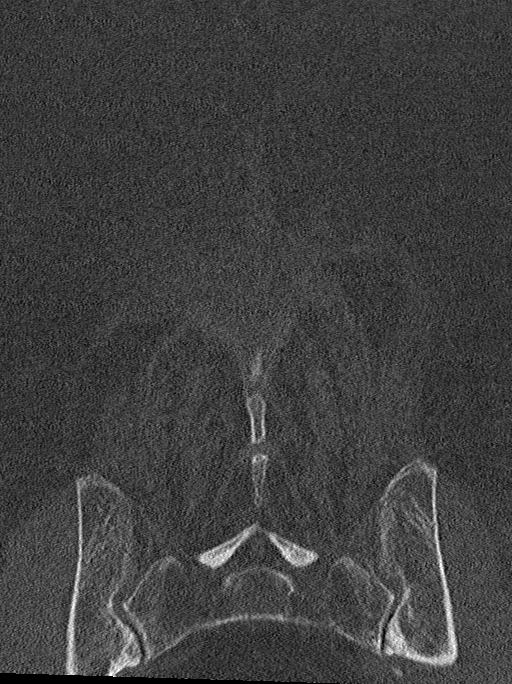
[im 30/75  bone]
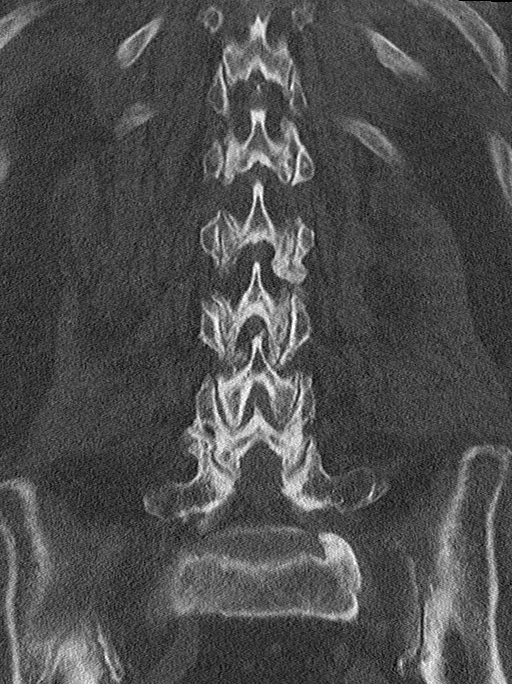
[im 45/75  bone]
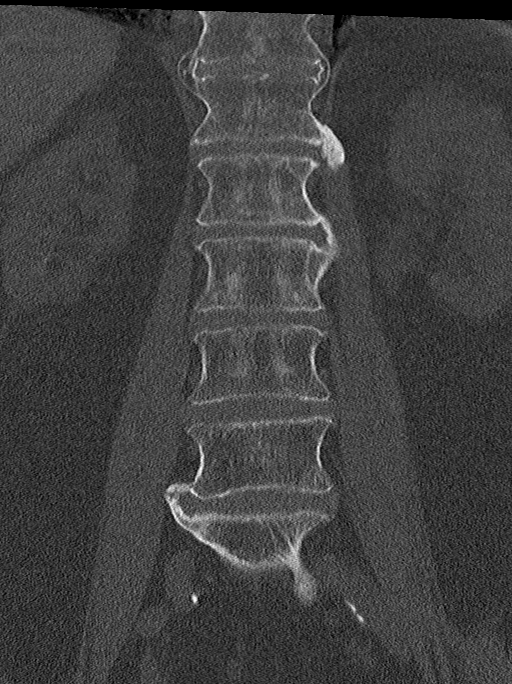

[13 of 33 positions shown; findings below may reference images not displayed]

FINDINGS: Segmentation: 5 lumbar type vertebrae.

Alignment: Normal.

Vertebrae: No acute fracture or focal pathologic process. Closely
opposed spinous processes.

Paraspinal and other soft tissues: Calcific atherosclerosis of the
aorta.

Disc levels:

L1-2: Mild disc bulge without spinal canal stenosis. Mild facet
hypertrophy.

L2-3: Mild disc bulge without spinal canal stenosis. Moderate facet
arthrosis.

L3-4: Intermediate disc bulge with moderate facet hypertrophy and
ligamentum flavum redundancy. Moderate spinal canal stenosis. Mild
right and left neural foraminal stenosis.

L4-5: Right eccentric disc bulge with mild-to-moderate right
foraminal stenosis. No central spinal canal stenosis. Moderate facet
hypertrophy.

L5-S1: Left eccentric disc bulge with large osteophyte. Mild facet
hypertrophy. No spinal canal stenosis. Mild bilateral neural
foraminal stenosis.
IMPRESSION: 1. No acute abnormality of the lumbar spine.
2. Moderate spinal canal stenosis at L3-4 secondary to intermediate
sized disc bulge and moderate facet hypertrophy.
3. Multilevel facet arthrosis, which may be a source of local low
back pain.
4.  Aortic atherosclerosis (YLLEU-H92.2).

## 2021-04-21 IMAGING — DX RIGHT ELBOW - COMPLETE 3+ VIEW
4 series · 4 of 4 positions shown · non-contrast
Comparison: None.

CLINICAL DATA: Recent fall with right elbow pain, initial encounter

EXAM:
RIGHT ELBOW - COMPLETE 3+ VIEW

[elbow ap]
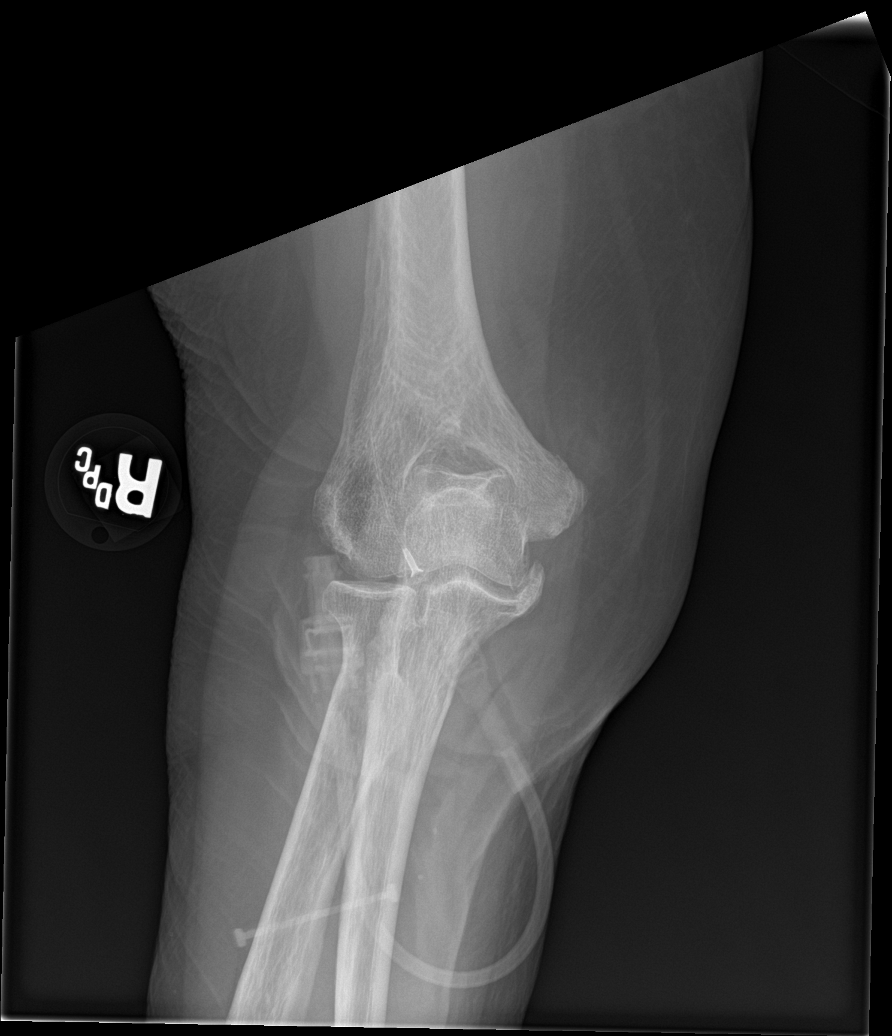

[elbow obl (1 of 2)]
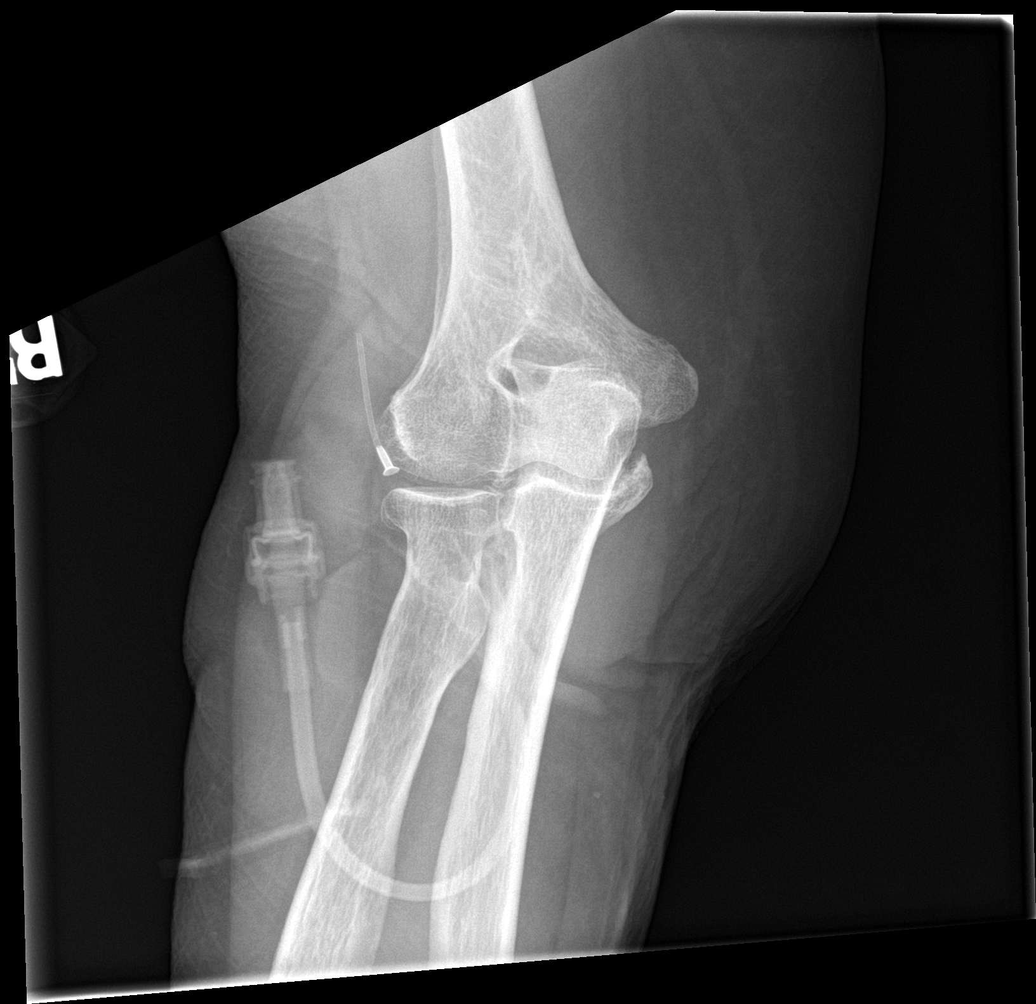

[elbow obl (2 of 2)]
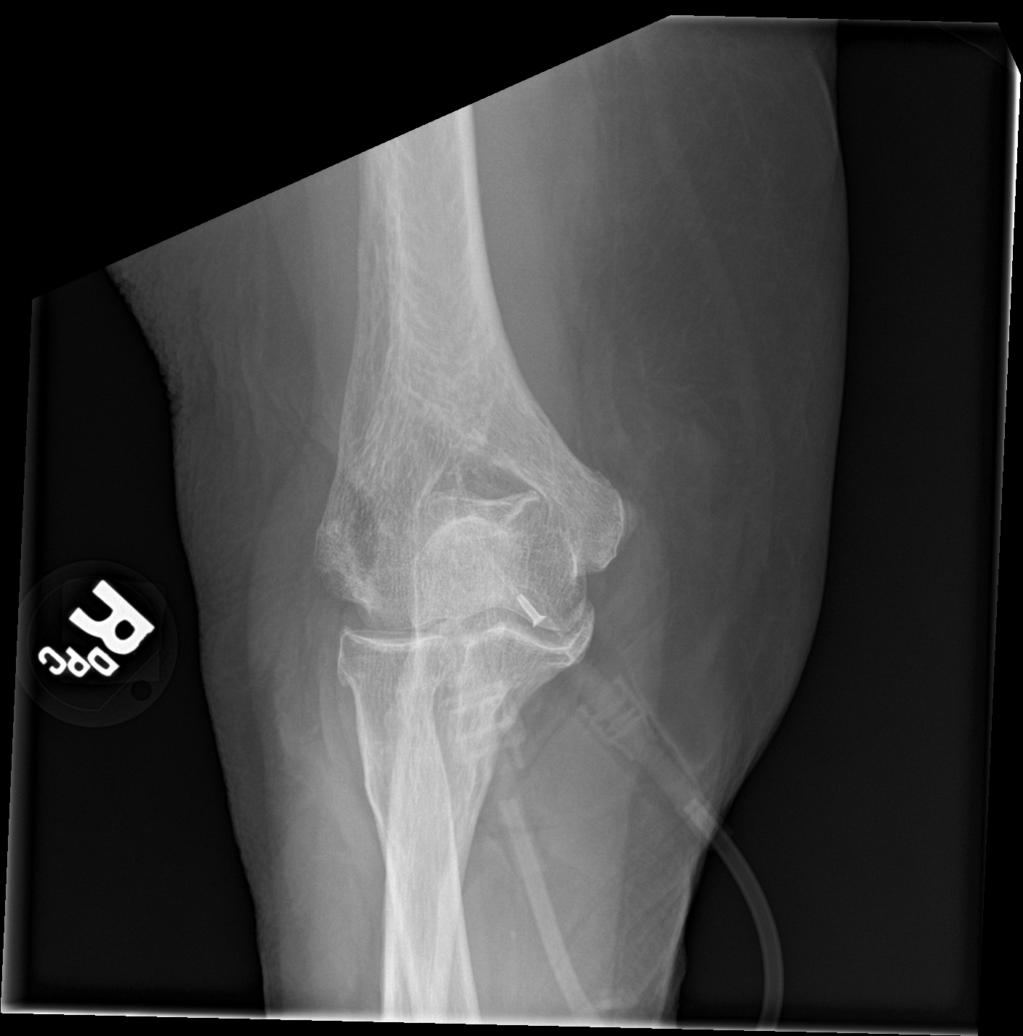

[elbow lat]
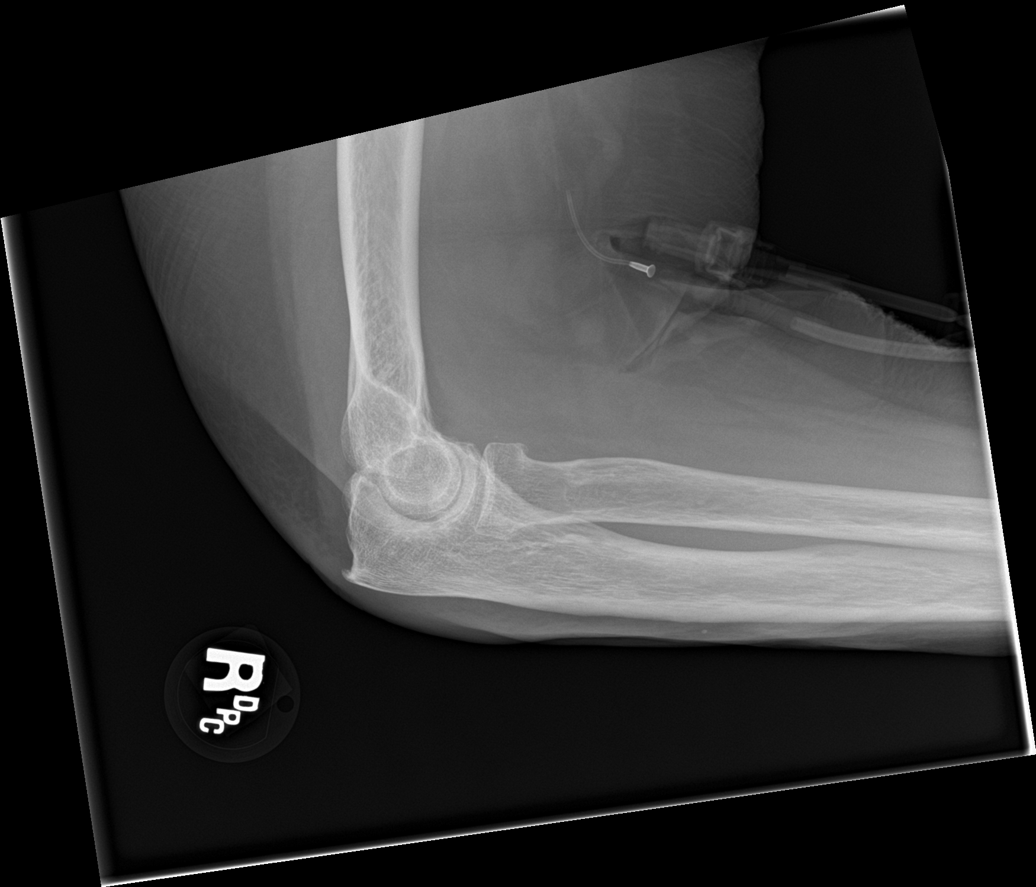

[4 of 4 positions shown; findings below may reference images not displayed]

FINDINGS: Olecranon spurring is noted. No acute fracture or dislocation is
seen. No joint effusion is noted.
IMPRESSION: No acute abnormality seen.

## 2021-04-21 IMAGING — DX RIGHT KNEE - COMPLETE 4+ VIEW
4 series · 5 of 5 positions shown · non-contrast
Comparison: None.

CLINICAL DATA: 85-year-old female with fall and right knee pain.

EXAM:
RIGHT KNEE - COMPLETE 4+ VIEW

[knee ap (1 of 3)]
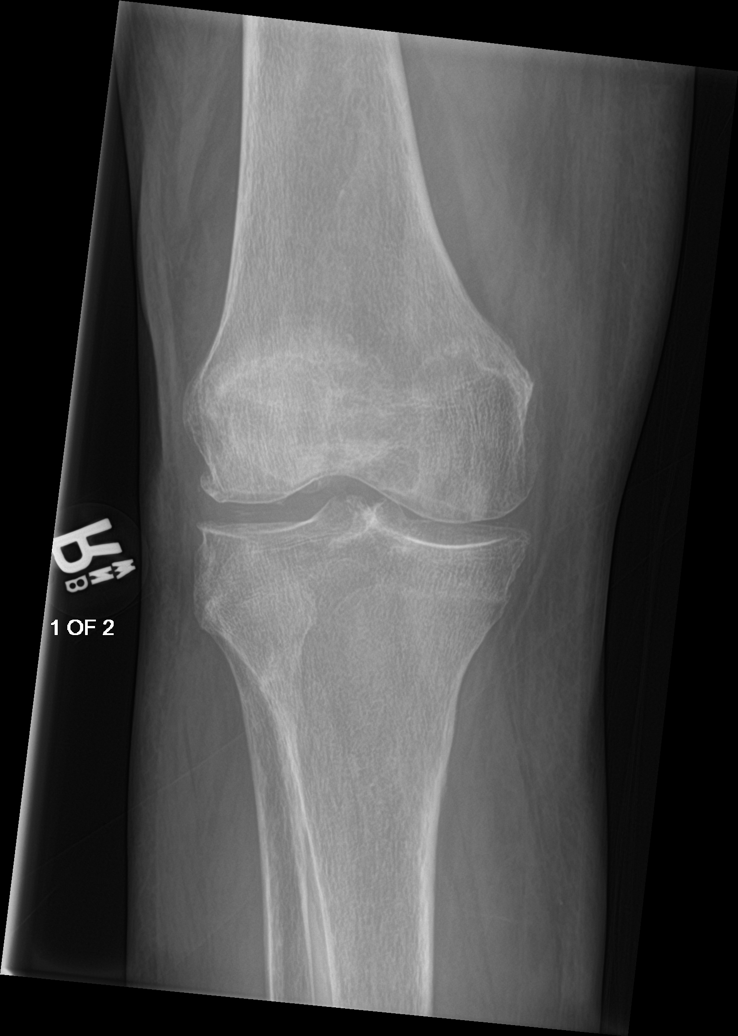

[Series 2: knee ap · 0.14mm/px · 2 of 2 slices shown (2 of 3)]
[im 1/2]
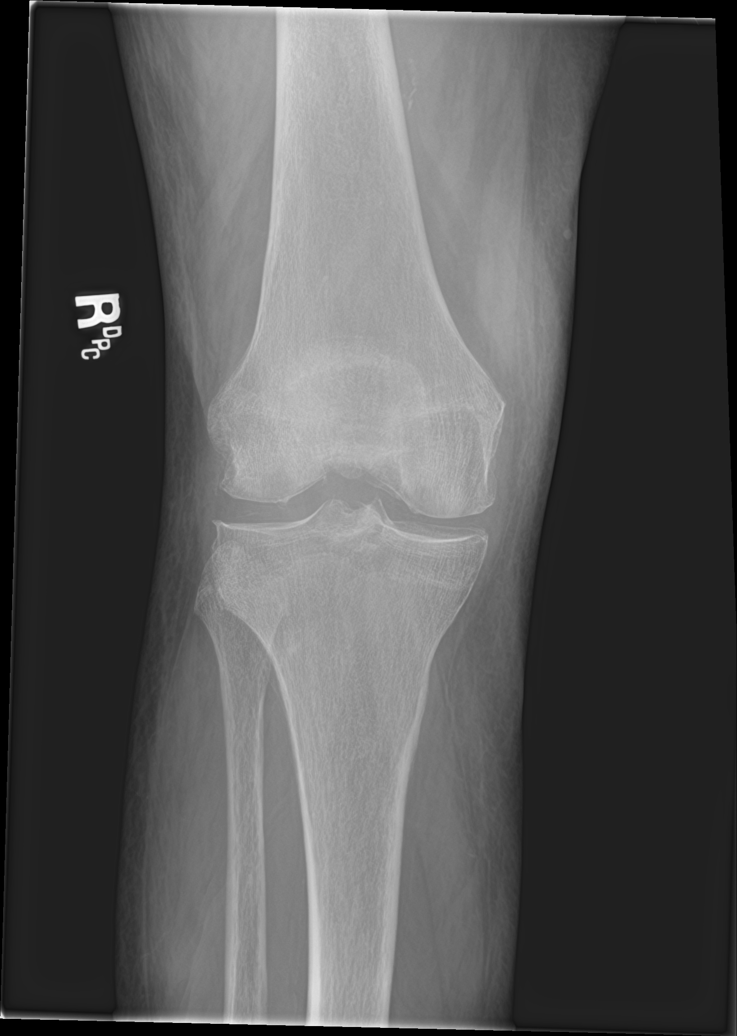
[im 2/2]
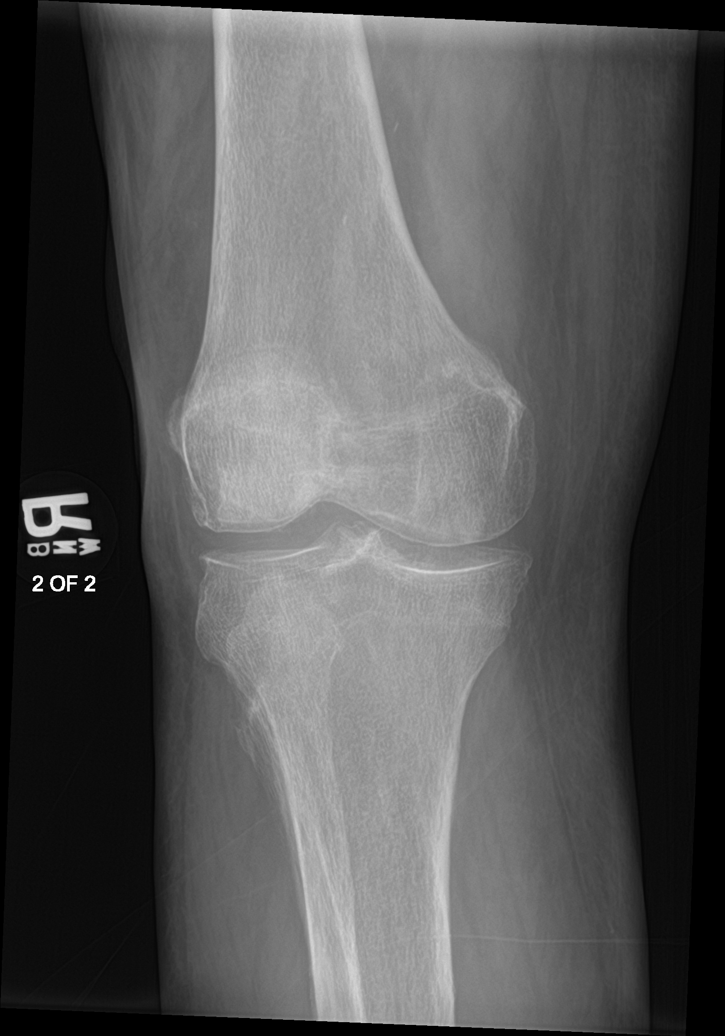

[knee ap (3 of 3)]
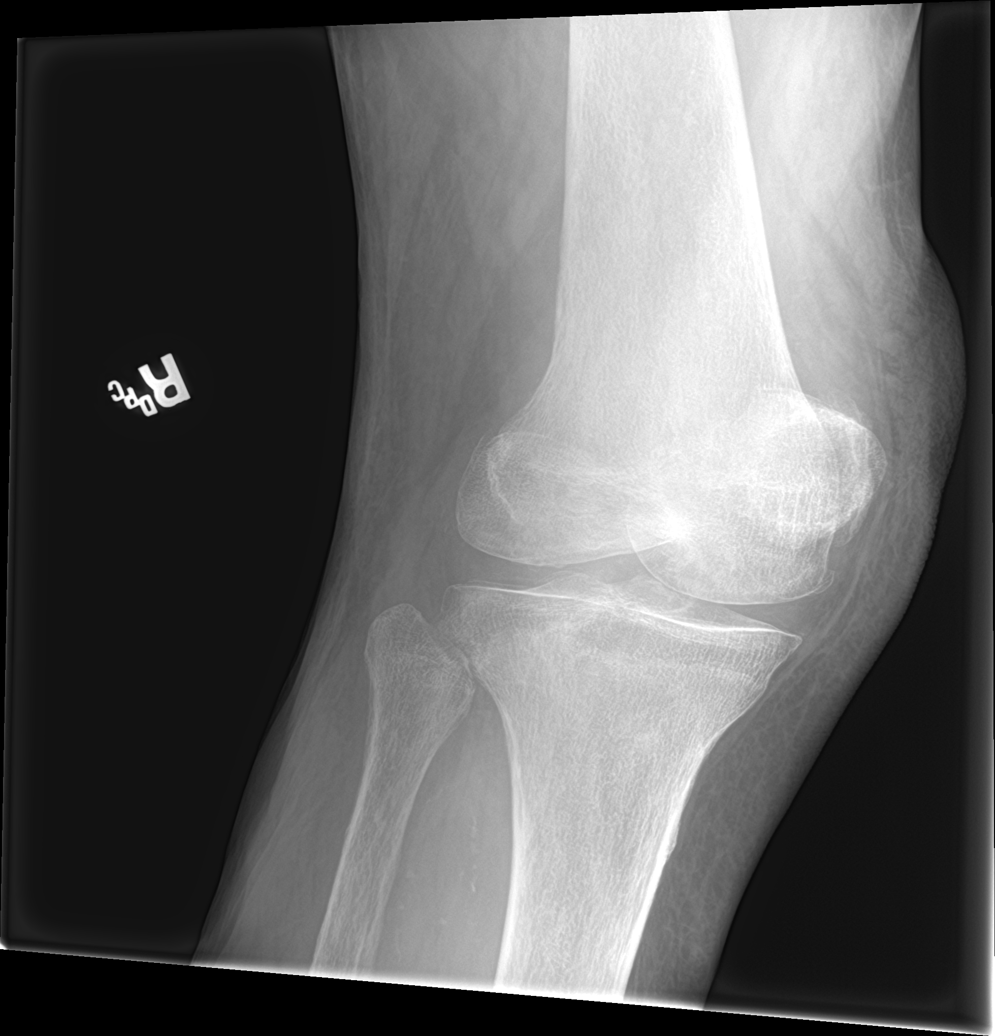

[knee lat]
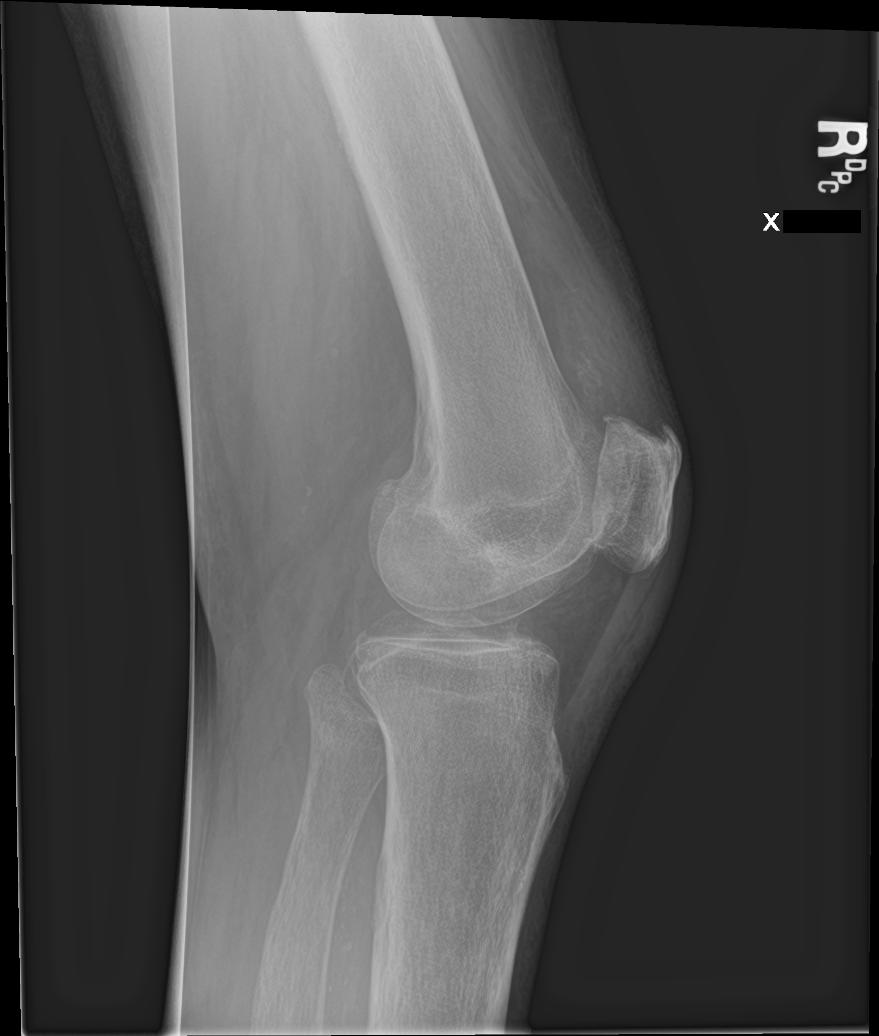

[5 of 5 positions shown; findings below may reference images not displayed]

FINDINGS: There is no acute fracture or dislocation. The bones are osteopenic.
Mild arthritic changes and meniscal chondrocalcinosis. No joint
effusion. The soft tissues are unremarkable.
IMPRESSION: No acute fracture or dislocation.
